# Patient Record
Sex: Female | Born: 1940 | Race: Black or African American | Hispanic: No | Smoking: Never smoker
Health system: Southern US, Community
[De-identification: ages and names within clinical notes are randomized; demographics above are authoritative.]

## PROBLEM LIST (undated history)

## (undated) DIAGNOSIS — R011 Cardiac murmur, unspecified: Secondary | ICD-10-CM

## (undated) DIAGNOSIS — Z9289 Personal history of other medical treatment: Secondary | ICD-10-CM

## (undated) DIAGNOSIS — H5440 Blindness, one eye, unspecified eye: Secondary | ICD-10-CM

## (undated) DIAGNOSIS — I671 Cerebral aneurysm, nonruptured: Secondary | ICD-10-CM

## (undated) DIAGNOSIS — F419 Anxiety disorder, unspecified: Secondary | ICD-10-CM

## (undated) DIAGNOSIS — E119 Type 2 diabetes mellitus without complications: Secondary | ICD-10-CM

## (undated) DIAGNOSIS — R519 Headache, unspecified: Secondary | ICD-10-CM

## (undated) DIAGNOSIS — I10 Essential (primary) hypertension: Secondary | ICD-10-CM

## (undated) DIAGNOSIS — J45909 Unspecified asthma, uncomplicated: Secondary | ICD-10-CM

## (undated) DIAGNOSIS — R51 Headache: Secondary | ICD-10-CM

## (undated) HISTORY — PX: TONSILLECTOMY: SUR1361

## (undated) HISTORY — PX: CATARACT EXTRACTION W/ INTRAOCULAR LENS  IMPLANT, BILATERAL: SHX1307

## (undated) HISTORY — PX: SHOULDER OPEN ROTATOR CUFF REPAIR: SHX2407

## (undated) HISTORY — PX: DILATION AND CURETTAGE OF UTERUS: SHX78

## (undated) HISTORY — PX: TUBAL LIGATION: SHX77

## (undated) HISTORY — PX: CARDIAC CATHETERIZATION: SHX172

## (undated) HISTORY — PX: LAPAROSCOPIC CHOLECYSTECTOMY: SUR755

---

## 2007-11-25 HISTORY — PX: BRAIN SURGERY: SHX531

## 2016-09-18 ENCOUNTER — Other Ambulatory Visit: Payer: Self-pay | Admitting: Nurse Practitioner

## 2016-09-18 DIAGNOSIS — R51 Headache: Principal | ICD-10-CM

## 2016-09-18 DIAGNOSIS — R519 Headache, unspecified: Secondary | ICD-10-CM

## 2016-10-02 ENCOUNTER — Other Ambulatory Visit: Payer: Self-pay | Admitting: Urology

## 2016-10-03 ENCOUNTER — Other Ambulatory Visit: Payer: Self-pay

## 2016-10-15 ENCOUNTER — Ambulatory Visit
Admission: RE | Admit: 2016-10-15 | Discharge: 2016-10-15 | Disposition: A | Discharge status: Home / Self Care | Payer: Medicare HMO | Source: Ambulatory Visit | Attending: Nurse Practitioner | Admitting: Nurse Practitioner

## 2016-10-15 DIAGNOSIS — R51 Headache: Principal | ICD-10-CM

## 2016-10-15 DIAGNOSIS — R519 Headache, unspecified: Secondary | ICD-10-CM

## 2016-10-21 ENCOUNTER — Observation Stay (HOSPITAL_COMMUNITY)
Admission: EM | Admit: 2016-10-21 | Discharge: 2016-10-23 | Disposition: A | Discharge status: Home / Self Care | Payer: Medicare HMO | Attending: Internal Medicine | Admitting: Internal Medicine

## 2016-10-21 ENCOUNTER — Encounter (HOSPITAL_COMMUNITY): Payer: Self-pay | Admitting: *Deleted

## 2016-10-21 ENCOUNTER — Emergency Department (HOSPITAL_COMMUNITY): Payer: Medicare HMO

## 2016-10-21 DIAGNOSIS — R109 Unspecified abdominal pain: Secondary | ICD-10-CM | POA: Diagnosis not present

## 2016-10-21 DIAGNOSIS — E860 Dehydration: Secondary | ICD-10-CM | POA: Diagnosis not present

## 2016-10-21 DIAGNOSIS — I729 Aneurysm of unspecified site: Secondary | ICD-10-CM

## 2016-10-21 DIAGNOSIS — I700 Atherosclerosis of aorta: Secondary | ICD-10-CM | POA: Diagnosis not present

## 2016-10-21 DIAGNOSIS — R55 Syncope and collapse: Principal | ICD-10-CM | POA: Insufficient documentation

## 2016-10-21 DIAGNOSIS — R197 Diarrhea, unspecified: Secondary | ICD-10-CM | POA: Insufficient documentation

## 2016-10-21 DIAGNOSIS — G9389 Other specified disorders of brain: Secondary | ICD-10-CM | POA: Diagnosis not present

## 2016-10-21 DIAGNOSIS — D259 Leiomyoma of uterus, unspecified: Secondary | ICD-10-CM | POA: Diagnosis not present

## 2016-10-21 DIAGNOSIS — I10 Essential (primary) hypertension: Secondary | ICD-10-CM | POA: Insufficient documentation

## 2016-10-21 DIAGNOSIS — I447 Left bundle-branch block, unspecified: Secondary | ICD-10-CM | POA: Insufficient documentation

## 2016-10-21 DIAGNOSIS — I671 Cerebral aneurysm, nonruptured: Secondary | ICD-10-CM | POA: Diagnosis not present

## 2016-10-21 DIAGNOSIS — Z8673 Personal history of transient ischemic attack (TIA), and cerebral infarction without residual deficits: Secondary | ICD-10-CM | POA: Insufficient documentation

## 2016-10-21 DIAGNOSIS — R4182 Altered mental status, unspecified: Secondary | ICD-10-CM

## 2016-10-21 DIAGNOSIS — Z87898 Personal history of other specified conditions: Secondary | ICD-10-CM

## 2016-10-21 HISTORY — DX: Cerebral aneurysm, nonruptured: I67.1

## 2016-10-21 LAB — URINALYSIS, ROUTINE W REFLEX MICROSCOPIC
Bilirubin Urine: NEGATIVE
Glucose, UA: >1000 mg/dL — AB
Ketones, ur: 15 mg/dL — AB
LEUKOCYTES UA: NEGATIVE
NITRITE: NEGATIVE
PROTEIN: NEGATIVE mg/dL
Specific Gravity, Urine: 1.024 (ref 1.005–1.030)
pH: 5 (ref 5.0–8.0)

## 2016-10-21 LAB — COMPREHENSIVE METABOLIC PANEL
ALT: 22 U/L (ref 14–54)
AST: 42 U/L — AB (ref 15–41)
Albumin: 4.3 g/dL (ref 3.5–5.0)
Alkaline Phosphatase: 71 U/L (ref 38–126)
Anion gap: 9 (ref 5–15)
BUN: 31 mg/dL — AB (ref 6–20)
CHLORIDE: 102 mmol/L (ref 101–111)
CO2: 26 mmol/L (ref 22–32)
Calcium: 10.1 mg/dL (ref 8.9–10.3)
Creatinine, Ser: 1.26 mg/dL — ABNORMAL HIGH (ref 0.44–1.00)
GFR, EST AFRICAN AMERICAN: 47 mL/min — AB (ref >60)
GFR, EST NON AFRICAN AMERICAN: 41 mL/min — AB (ref >60)
Glucose, Bld: 190 mg/dL — ABNORMAL HIGH (ref 65–99)
POTASSIUM: 4.5 mmol/L (ref 3.5–5.1)
Sodium: 137 mmol/L (ref 135–145)
Total Bilirubin: 1.4 mg/dL — ABNORMAL HIGH (ref 0.3–1.2)
Total Protein: 7.4 g/dL (ref 6.5–8.1)

## 2016-10-21 LAB — CBC WITH DIFFERENTIAL/PLATELET
BASOS ABS: 0.1 10*3/uL (ref 0.0–0.1)
Basophils Relative: 1 %
EOS PCT: 4 %
Eosinophils Absolute: 0.3 10*3/uL (ref 0.0–0.7)
HCT: 39.9 % (ref 36.0–46.0)
Hemoglobin: 12.9 g/dL (ref 12.0–15.0)
LYMPHS PCT: 32 %
Lymphs Abs: 2.4 10*3/uL (ref 0.7–4.0)
MCH: 26.8 pg (ref 26.0–34.0)
MCHC: 32.3 g/dL (ref 30.0–36.0)
MCV: 83 fL (ref 78.0–100.0)
MONO ABS: 0.5 10*3/uL (ref 0.1–1.0)
Monocytes Relative: 6 %
Neutro Abs: 4.3 10*3/uL (ref 1.7–7.7)
Neutrophils Relative %: 57 %
PLATELETS: 325 10*3/uL (ref 150–400)
RBC: 4.81 MIL/uL (ref 3.87–5.11)
RDW: 13.5 % (ref 11.5–15.5)
WBC: 7.5 10*3/uL (ref 4.0–10.5)

## 2016-10-21 LAB — MAGNESIUM: Magnesium: 2 mg/dL (ref 1.7–2.4)

## 2016-10-21 LAB — TROPONIN I: Troponin I: <0.03 ng/mL (ref <0.03)

## 2016-10-21 LAB — TSH: TSH: 0.882 u[IU]/mL (ref 0.350–4.500)

## 2016-10-21 LAB — URINE MICROSCOPIC-ADD ON

## 2016-10-21 MED ORDER — LORAZEPAM 2 MG/ML IJ SOLN
0.5000 mg | Freq: Once | INTRAMUSCULAR | Status: DC
  Filled 2016-10-21: qty 1

## 2016-10-21 MED ORDER — METOPROLOL SUCCINATE ER 100 MG PO TB24
100.0000 mg | ORAL_TABLET | Freq: Every evening | ORAL | Status: DC
  Administered 2016-10-21 – 2016-10-22 (×2): 100 mg via ORAL
  Filled 2016-10-21 (×2): qty 1

## 2016-10-21 MED ORDER — SODIUM CHLORIDE 0.9 % IV SOLN: INTRAVENOUS | Status: DC

## 2016-10-21 MED ORDER — ONDANSETRON HCL 4 MG/2ML IJ SOLN: 4.0000 mg | Freq: Four times a day (QID) | INTRAMUSCULAR | Status: DC | PRN

## 2016-10-21 MED ORDER — METOPROLOL SUCCINATE ER 25 MG PO TB24
50.0000 mg | ORAL_TABLET | Freq: Every day | ORAL | Status: DC
  Administered 2016-10-22 – 2016-10-23 (×2): 50 mg via ORAL
  Filled 2016-10-21 (×2): qty 2

## 2016-10-21 MED ORDER — IRBESARTAN 150 MG PO TABS
150.0000 mg | ORAL_TABLET | Freq: Every day | ORAL | Status: DC
  Filled 2016-10-21 (×2): qty 1

## 2016-10-21 MED ORDER — METOPROLOL SUCCINATE ER 25 MG PO TB24: 50.0000 mg | ORAL_TABLET | ORAL | Status: DC

## 2016-10-21 MED ORDER — ONDANSETRON HCL 4 MG PO TABS: 4.0000 mg | ORAL_TABLET | Freq: Four times a day (QID) | ORAL | Status: DC | PRN

## 2016-10-21 MED ORDER — ALBUTEROL SULFATE (2.5 MG/3ML) 0.083% IN NEBU: 2.5000 mg | INHALATION_SOLUTION | RESPIRATORY_TRACT | Status: DC | PRN

## 2016-10-21 MED ORDER — SODIUM CHLORIDE 0.9% FLUSH
3.0000 mL | Freq: Two times a day (BID) | INTRAVENOUS | Status: DC
Start: 2016-10-21 — End: 2016-10-23

## 2016-10-21 MED ORDER — LEVALBUTEROL HCL 0.63 MG/3ML IN NEBU: 0.6300 mg | INHALATION_SOLUTION | Freq: Four times a day (QID) | RESPIRATORY_TRACT | Status: DC | PRN

## 2016-10-21 MED ORDER — POTASSIUM CHLORIDE IN NACL 20-0.9 MEQ/L-% IV SOLN: INTRAVENOUS | Status: DC

## 2016-10-21 MED ORDER — IOPAMIDOL (ISOVUE-370) INJECTION 76%
INTRAVENOUS | Status: AC
  Administered 2016-10-21: 80 mL
  Filled 2016-10-21: qty 100

## 2016-10-21 MED ORDER — ENOXAPARIN SODIUM 40 MG/0.4ML ~~LOC~~ SOLN
40.0000 mg | SUBCUTANEOUS | Status: DC
  Filled 2016-10-21 (×2): qty 0.4

## 2016-10-21 MED ORDER — SODIUM CHLORIDE 0.9 % IV BOLUS (SEPSIS)
1000.0000 mL | Freq: Once | INTRAVENOUS | Status: AC
  Administered 2016-10-21: 1000 mL via INTRAVENOUS

## 2016-10-21 MED ORDER — ACETAMINOPHEN 325 MG PO TABS: 650.0000 mg | ORAL_TABLET | Freq: Four times a day (QID) | ORAL | Status: DC | PRN

## 2016-10-21 MED ORDER — AMLODIPINE BESYLATE 10 MG PO TABS
10.0000 mg | ORAL_TABLET | Freq: Every day | ORAL | Status: DC
  Administered 2016-10-22: 10 mg via ORAL
  Filled 2016-10-21 (×2): qty 1

## 2016-10-21 MED ORDER — ACETAMINOPHEN 650 MG RE SUPP: 650.0000 mg | Freq: Four times a day (QID) | RECTAL | Status: DC | PRN

## 2016-10-21 NOTE — ED Provider Notes (Signed)
[facility] DEPT Provider Note   CSN: [id number] Arrival date & time: 10/21/16  1227     History   Chief Complaint Chief Complaint  Patient presents with  . Loss of Consciousness    HPI Arisbet Deterding is a 75 y.o. female.  HPI   Pt with hx cerebral aneurysm clipped in 2009, with recent increase in headaches, negative head CT 10/15/16.  P/W abdominal pain, N/V/D, syncope, diaphoresis that occurred this morning.  Patient reports her abdomen started hurting this morning, located in her lower abdomen with radiation all around her abdomen.  She went to bathroom and started having profuse diarrhea, felt nauseated wanting to vomit.  She was yelling due to the pain.  Developed lightheadedness.  Her daughter came and pt went limp and became diaphoretic.  This lasted several minutes until EMS came, put pt on bed and her gaze became fixed, she said "I can't breathe" and lost consciousness.  This lasted 45-60 seconds.  She became conscious but was confused, was asking to go home despite being in her home.  Daughter reports now she is coming back to her normal level of consciousness.      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 Cerebral aneurysm     Patient Active Problem List   Diagnosis Date Noted  . Syncope and collapse 10/21/2016    History reviewed. No pertinent surgical history.  OB History    No data available       Home Medications    Prior to Admission medications   Medication Sig Start Date End Date Taking? Authorizing Provider  albuterol (PROVENTIL HFA;VENTOLIN HFA) 108 (90 Base) MCG/ACT inhaler Inhale into the lungs every 6 (six) hours as needed for wheezing or shortness of breath.   Yes Historical Provider, MD  amLODipine (NORVASC) 10 MG tablet Take 10 mg by mouth daily.   Yes Historical Provider, MD  canagliflozin (INVOKANA) 300 MG TABS tablet Take 300 mg by mouth daily  before breakfast.   Yes Historical Provider, MD  docusate sodium (COLACE) 100 MG capsule Take 100 mg by mouth 2 (two) times daily.   Yes Historical Provider, MD  hydrochlorothiazide (HYDRODIURIL) 25 MG tablet Take 25 mg by mouth daily.   Yes Historical Provider, MD  metoprolol succinate (TOPROL-XL) 100 MG 24 hr tablet Take 50-100 mg by mouth See admin instructions. Take with or immediately following a meal. Take 50mg  in the morning then take 100mg  in the evening   Yes Historical Provider, MD  Multiple Vitamin (MULTIVITAMIN WITH MINERALS) TABS tablet Take 1 tablet by mouth daily.   Yes Historical Provider, MD  nitrofurantoin (MACRODANTIN) 100 MG capsule Take 100 mg by mouth 2 (two) times daily.   Yes Historical Provider, MD  valsartan (DIOVAN) 160 MG tablet Take 160 mg by mouth daily.   Yes Historical Provider, MD    Family History History reviewed. No pertinent family history.  Social History Social History  Substance Use Topics  . Smoking status: Never Smoker  . Smokeless tobacco: Never Used  . Alcohol use Not on file     Allergies   Patient has no known allergies.   Review of Systems Review of Systems   Physical Exam Updated Vital Signs BP 123/69   Pulse 66   Temp 97.3 F (36.3 C) (Oral)   Resp 14   SpO2 98%   Physical Exam  Constitutional: She appears well-developed and well-nourished.  No distress.  HENT:  Head: Normocephalic and atraumatic.  Neck: Neck supple.  Cardiovascular: Normal rate and regular rhythm.   Pulmonary/Chest: Effort normal and breath sounds normal. No respiratory distress. She has no wheezes. She has no rales.  Abdominal: Soft. She exhibits no distension. There is no tenderness. There is no rebound and no guarding.  Neurological: She is alert.  CN II-XII intact, EOMs intact, no pronator drift, grip strengths equal bilaterally; strength 5/5 in all extremities, sensation intact in all extremities; finger to nose, heel to shin, rapid alternating  movements normal.     Skin: She is not diaphoretic.  Nursing note and vitals reviewed.    ED Treatments / Results  Labs (all labs ordered are listed, but only abnormal results are displayed) Labs Reviewed  COMPREHENSIVE METABOLIC PANEL - Abnormal; Notable for the following:       Result Value   Glucose, Bld 190 (*)    BUN 31 (*)    Creatinine, Ser 1.26 (*)    AST 42 (*)    Total Bilirubin 1.4 (*)    GFR calc non Af Amer 41 (*)    GFR calc Af Amer 47 (*)    All other components within normal limits  URINALYSIS, ROUTINE W REFLEX MICROSCOPIC (NOT AT [facility]) - Abnormal; Notable for the following:    Glucose, UA >1000 (*)    Hgb urine dipstick TRACE (*)    Ketones, ur 15 (*)    All other components within normal limits  URINE MICROSCOPIC-ADD ON - Abnormal; Notable for the following:    Squamous Epithelial / LPF 0-5 (*)    Bacteria, UA RARE (*)    Casts HYALINE CASTS (*)    All other components within normal limits  CBC WITH DIFFERENTIAL/PLATELET  TROPONIN I  CBC  CREATININE, SERUM  MAGNESIUM  TROPONIN I  TROPONIN I  TROPONIN I  HEMOGLOBIN A1C  TSH  URINALYSIS, ROUTINE W REFLEX MICROSCOPIC (NOT AT [facility])    EKG  EKG Interpretation  Date/Time:  Tuesday October 21 2016 12:38:50 EST Ventricular Rate:  59 PR Interval:    QRS Duration: 110 QT Interval:  439 QTC Calculation: 435 R Axis:   57 Text Interpretation:  Sinus rhythm Incomplete left bundle branch block Minimal ST elevation, anterior leads No old tracing to compare Confirmed by [facility] MD, Corene Cornea [phone]) on 10/21/2016 12:46:18 PM       Radiology Ct Angio Chest Pe W And/or Wo Contrast  Result Date: 10/21/2016 CLINICAL DATA:  Shortness of breath and syncopal episode EXAM: CT ANGIOGRAPHY CHEST CT ABDOMEN AND PELVIS WITH CONTRAST TECHNIQUE: Multidetector CT imaging of the chest was performed using the standard protocol during bolus administration of intravenous contrast. Multiplanar CT image reconstructions and MIPs  were obtained to evaluate the vascular anatomy. Multidetector CT imaging of the abdomen and pelvis was performed using the standard protocol during bolus administration of intravenous contrast. CONTRAST:  80 mL Isovue 370. COMPARISON:  None. FINDINGS: CTA CHEST FINDINGS Cardiovascular: The thoracic aorta demonstrates diffuse calcifications without aneurysmal dilatation. Coronary calcifications are seen. The timing of the contrast bolus was not for dissection evaluation. The pulmonary artery shows a normal branching pattern without intraluminal filling defect to suggest pulmonary embolism. Mediastinum/Nodes: No hilar or mediastinal adenopathy is identified. The thoracic inlet is within normal limits. No axillary adenopathy is noted. The esophagus as visualized is within normal limits. Lungs/Pleura: The lungs are well-aerated without focal infiltrate or sizable effusion. No parenchymal nodules are seen. Musculoskeletal: Degenerative changes of  the thoracic spine are noted. No compression deformities are seen. Review of the MIP images confirms the above findings. CT ABDOMEN and PELVIS FINDINGS Hepatobiliary: The gallbladder has been surgically removed. Mild biliary ductal dilatation is noted in a compensatory fashion. The liver is otherwise unremarkable. Pancreas: Unremarkable. No pancreatic ductal dilatation or surrounding inflammatory changes. Spleen: Normal in size without focal abnormality. Adrenals/Urinary Tract: The adrenal glands are within normal limits. Scattered cysts are noted throughout both kidneys. No renal calculi or obstructive changes are seen. Stomach/Bowel: The appendix is within normal limits. Scattered diverticular changes noted. No evidence of diverticulitis is seen. No obstructive changes are noted. Vascular/Lymphatic: Aortic atherosclerosis. No enlarged abdominal or pelvic lymph nodes. Reproductive: Calcified uterine fibroids are seen. Just to the left of the vaginal vault there is a rounded  area of fluid attenuation which measures approximately 11 mm. This may represent a small Gartner's duct cyst. Correlation with the physical exam is recommended. Other: No abdominal wall hernia or abnormality. No abdominopelvic ascites. Musculoskeletal: Degenerative changes of lumbar spine are noted. Review of the MIP images confirms the above findings. IMPRESSION: CTA of the chest: No evidence of pulmonary emboli. CT of the abdomen and pelvis:  Scattered renal cystic change. Fluid attenuation lesion just to the left of the vaginal wall which may represent a small Gartner's duct cyst. Clinical correlation is recommended. No other focal abnormality is noted. Electronically Signed   By: Inez Catalina M.D.   On: 10/21/2016 15:25   Ct Abdomen Pelvis W Contrast  Result Date: 10/21/2016 CLINICAL DATA:  Shortness of breath and syncopal episode EXAM: CT ANGIOGRAPHY CHEST CT ABDOMEN AND PELVIS WITH CONTRAST TECHNIQUE: Multidetector CT imaging of the chest was performed using the standard protocol during bolus administration of intravenous contrast. Multiplanar CT image reconstructions and MIPs were obtained to evaluate the vascular anatomy. Multidetector CT imaging of the abdomen and pelvis was performed using the standard protocol during bolus administration of intravenous contrast. CONTRAST:  80 mL Isovue 370. COMPARISON:  None. FINDINGS: CTA CHEST FINDINGS Cardiovascular: The thoracic aorta demonstrates diffuse calcifications without aneurysmal dilatation. Coronary calcifications are seen. The timing of the contrast bolus was not for dissection evaluation. The pulmonary artery shows a normal branching pattern without intraluminal filling defect to suggest pulmonary embolism. Mediastinum/Nodes: No hilar or mediastinal adenopathy is identified. The thoracic inlet is within normal limits. No axillary adenopathy is noted. The esophagus as visualized is within normal limits. Lungs/Pleura: The lungs are well-aerated without  focal infiltrate or sizable effusion. No parenchymal nodules are seen. Musculoskeletal: Degenerative changes of the thoracic spine are noted. No compression deformities are seen. Review of the MIP images confirms the above findings. CT ABDOMEN and PELVIS FINDINGS Hepatobiliary: The gallbladder has been surgically removed. Mild biliary ductal dilatation is noted in a compensatory fashion. The liver is otherwise unremarkable. Pancreas: Unremarkable. No pancreatic ductal dilatation or surrounding inflammatory changes. Spleen: Normal in size without focal abnormality. Adrenals/Urinary Tract: The adrenal glands are within normal limits. Scattered cysts are noted throughout both kidneys. No renal calculi or obstructive changes are seen. Stomach/Bowel: The appendix is within normal limits. Scattered diverticular changes noted. No evidence of diverticulitis is seen. No obstructive changes are noted. Vascular/Lymphatic: Aortic atherosclerosis. No enlarged abdominal or pelvic lymph nodes. Reproductive: Calcified uterine fibroids are seen. Just to the left of the vaginal vault there is a rounded area of fluid attenuation which measures approximately 11 mm. This may represent a small Gartner's duct cyst. Correlation with the physical exam is recommended. Other:  No abdominal wall hernia or abnormality. No abdominopelvic ascites. Musculoskeletal: Degenerative changes of lumbar spine are noted. Review of the MIP images confirms the above findings. IMPRESSION: CTA of the chest: No evidence of pulmonary emboli. CT of the abdomen and pelvis:  Scattered renal cystic change. Fluid attenuation lesion just to the left of the vaginal wall which may represent a small Gartner's duct cyst. Clinical correlation is recommended. No other focal abnormality is noted. Electronically Signed   By: Inez Catalina M.D.   On: 10/21/2016 15:25    Procedures Procedures (including critical care time)  Medications Ordered in ED Medications    enoxaparin (LOVENOX) injection 40 mg (not administered)  sodium chloride flush (NS) 0.9 % injection 3 mL (not administered)  levalbuterol (XOPENEX) nebulizer solution 0.63 mg (not administered)  ondansetron (ZOFRAN) tablet 4 mg (not administered)    Or  ondansetron (ZOFRAN) injection 4 mg (not administered)  acetaminophen (TYLENOL) tablet 650 mg (not administered)    Or  acetaminophen (TYLENOL) suppository 650 mg (not administered)  0.9 % NaCl with KCl 20 mEq/ L  infusion (not administered)  sodium chloride 0.9 % bolus 1,000 mL (0 mLs Intravenous Stopped 10/21/16 1530)  iopamidol (ISOVUE-370) 76 % injection (80 mLs  Contrast Given 10/21/16 1442)     Initial Impression / Assessment and Plan / ED Course  I have reviewed the triage vital signs and the nursing notes.  Pertinent labs & imaging results that were available during my care of the patient were reviewed by me and considered in my medical decision making (see chart for details).  Clinical Course as of Oct 22 1639  Tue Oct 21, 2016  1556 Paged as unassigned admission.  Per family medicine, Dr Randall Hiss Dean's patients go to [facility].  Will page [facility].    [EW]    Clinical Course User Index [EW] Clayton Bibles, PA-C    Afebrile nontoxic patient with episode of syncope vs seizure following abdominal pain with vomiting and diarrhea.  She did have some staring off and said she could not breathe prior to syncopal episode.  EKG demonstrates some mild ST elevation anteriorly.  There is no old EKG for comparison.  Workup reassuring.  Pt also seen by and discussed with Dr Dayna Barker.  Pt admitted to [facility] for overnight observation on telemetry.    Final Clinical Impressions(s) / ED Diagnoses   Final diagnoses:  Syncope, unspecified syncope type    New Prescriptions New Prescriptions   No medications on file     Clayton Bibles, PA-C 10/21/16 1640    Merrily Pew, MD 10/21/16 1708

## 2016-10-21 NOTE — Progress Notes (Signed)
Patient admitted to floor, denies pain, A&O X4.  MD notified of arrival

## 2016-10-21 NOTE — ED Notes (Signed)
Called CT and informed them pt is ready for transport.

## 2016-10-21 NOTE — Progress Notes (Addendum)
On call notified 2214 for PRN for MRI. Continue to monitor. Order placed 2218 ativan 0.5mg  x1.

## 2016-10-21 NOTE — ED Notes (Signed)
Pt unable to urinate, had diarrhea. Pt informed we will need to do an I&O catheter and she refused. Raquel Sarna, [place] informed.

## 2016-10-21 NOTE — ED Triage Notes (Signed)
Pt arrives from home via [facility]. Pt daughter states pt was on the toilet having a BM when she had a syncopal episode. Fire arrived and attempted to get pt from the toilet to her bedroom and pt experienced another syncopal episode. Fire states pt was gazing off to the right and was aroused only by sternal rub after the seizure. Pt has a hx of a cerebral anneurysm and has recently been complaining of headaches.

## 2016-10-21 NOTE — ED Provider Notes (Signed)
Medical screening examination/treatment/procedure(s) were conducted as a shared visit with non-physician practitioner(s) and myself.  I personally evaluated the patient during the encounter.  Patient's daughter relays history that the patient syncopized right after having diarrhea. But was not having any bradycardia as she checked her pulse at the same time. Apparently The patient appeared sweaty and then subsequently had a shaking episode. She syncopized after that as well. Exam here she has normal vital signs, appears well and is neurologically intact. EKG is okay. Labs are okay. Heart is regular rate and rhythm no murmurs rubs or gallops. Lungs clear to auscultation bilaterally. Unsure of cause of symptoms however questionable seizure activity. Could also have had an arrhythmia although this is unlikely with a measured heart rate in the 80s. Due to the unclear nature of this limits who medicine for further workup and management.   EKG Interpretation  Date/Time:  Tuesday October 21 2016 12:38:50 EST Ventricular Rate:  59 PR Interval:    QRS Duration: 110 QT Interval:  439 QTC Calculation: 435 R Axis:   57 Text Interpretation:  Sinus rhythm Incomplete left bundle branch block Minimal ST elevation, anterior leads No old tracing to compare Confirmed by [facility] MD, Adriann Thau [phone]) on 10/21/2016 12:46:18 PM         Merrily Pew, MD 10/21/16 1708

## 2016-10-21 NOTE — H&P (Signed)
[facility] History and Physical  Jessica Rich [id number] DOB: 14-Jun-1941 DOA: 10/21/2016  Referring physician:  PCP: Rogers Blocker, MD   Chief Complaint: Syncope  HPI:  75 year old female with a brain aneurysm, hypertension presents to the ER via EMS after patient found on the toilet by EMS. Patient's daughter who is an Therapist, sports states that patient woke up this morning, complained of suprapubic discomfort radiating upwards into her abdomen, followed by one episode of diarrhea. Patient became diaphoretic, nauseous, dry heaving on the commode. She called EMS. As soreness the transferred her to the bed, patient experienced a syncopal episode. It lasted a few minutes. During this episode patient was found to have [place] fixed gaze to the right, and was aroused by sternal rub. Patient subsequently had another episode of unresponsiveness 5 minutes later. ER-briefly hypotensive with systolic blood pressure in the 90s, normal sinus rhythm, afebrile, creatinine 1.26, troponin -0.03, white blood cell count 7.5. UA negative. 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  HEENT: Denies photophobia, eye pain, redness, hearing loss, ear pain, congestion, sore throat, rhinorrhea, sneezing, mouth sores, trouble swallowing, neck pain, neck stiffness and tinnitus.  Respiratory: Denies SOB, DOE, cough, chest tightness, and wheezing.  Cardiovascular: Denies chest pain, palpitations and leg swelling.  Gastrointestinal: Positive for nausea, vomiting, abdominal pain, diarrhea, constipation, blood in stool and abdominal distention.  Genitourinary: Denies dysuria, urgency, frequency, hematuria, flank pain and difficulty urinating.  Musculoskeletal: Denies myalgias, back pain, joint swelling, arthralgias and gait problem.  Skin: Denies pallor, rash and wound.  Neurological: Denies dizziness,  seizures, syncope, weakness, light-headedness, numbness and headaches.  Hematological: Denies adenopathy. Easy bruising, personal or family bleeding history  Psychiatric/Behavioral: Denies suicidal ideation, mood changes, confusion, nervousness, sleep disturbance and agitation       Past Medical History:  Diagnosis Date  . Cerebral aneurysm      History reviewed. No pertinent surgical history.    Social History:  reports that she has never smoked. She has never used smokeless tobacco. Her alcohol and drug histories are not on file.    No Known Allergies      FAMILY HISTORY  When questioned  Directly-patient reports  No family history of HTN, CVA ,DIABETES, TB, Cancer CAD, Bleeding Disorders, Sickle Cell, diabetes, anemia, asthma,   Prior to Admission medications   Medication Sig Start Date End Date Taking? Authorizing Provider  albuterol (PROVENTIL HFA;VENTOLIN HFA) 108 (90 Base) MCG/ACT inhaler Inhale into the lungs every 6 (six) hours as needed for wheezing or shortness of breath.   Yes Historical Provider, MD  amLODipine (NORVASC) 10 MG tablet Take 10 mg by mouth daily.   Yes Historical Provider, MD  canagliflozin (INVOKANA) 300 MG TABS tablet Take 300 mg by mouth daily before breakfast.   Yes Historical Provider, MD  docusate sodium (COLACE) 100 MG capsule Take 100 mg by mouth 2 (two) times daily.   Yes Historical Provider, MD  hydrochlorothiazide (HYDRODIURIL) 25 MG tablet Take 25 mg by mouth daily.   Yes Historical Provider, MD  metoprolol succinate (TOPROL-XL) 100 MG 24 hr tablet Take 50-100 mg by mouth See admin instructions. Take with or immediately following a meal. Take 50mg  in the morning then take 100mg  in the evening   Yes Historical Provider, MD  Multiple Vitamin (MULTIVITAMIN WITH MINERALS) TABS tablet Take 1 tablet by mouth daily.   Yes Historical Provider, MD  nitrofurantoin (MACRODANTIN) 100 MG capsule Take 100 mg by mouth 2 (two) times daily.   Yes Historical  Provider, MD  valsartan (DIOVAN) 160 MG tablet Take 160 mg by mouth daily.   Yes Historical Provider, MD     Physical Exam: Vitals:   10/21/16 1600 10/21/16 1645 10/21/16 1720 10/21/16 1750  BP: 123/69 135/87 (!) 121/91 (!) 157/64  Pulse: 66 64 61 62  Resp: 14 18 18 19   Temp:   97.6 F (36.4 C) 98.5 F (36.9 C)  TempSrc:   Oral Oral  SpO2: 98% 100% 97% 98%      Constitutional: NAD, calm, Alert and oriented 3 Vitals:   10/21/16 1600 10/21/16 1645 10/21/16 1720 10/21/16 1750  BP: 123/69 135/87 (!) 121/91 (!) 157/64  Pulse: 66 64 61 62  Resp: 14 18 18 19   Temp:   97.6 F (36.4 C) 98.5 F (36.9 C)  TempSrc:   Oral Oral  SpO2: 98% 100% 97% 98%   Eyes: PERRL, lids and conjunctivae normal ENMT: Mucous membranes are moist. Posterior pharynx clear of any exudate or lesions.Normal dentition.  Neck: normal, supple, no masses, no thyromegaly Respiratory: clear to auscultation bilaterally, no wheezing, no crackles. Normal respiratory effort. No accessory muscle use.  Cardiovascular: Regular rate and rhythm, no murmurs / rubs / gallops. No extremity edema. 2+ pedal pulses. No carotid bruits.  Abdomen: no tenderness, no masses palpated. No hepatosplenomegaly. Bowel sounds positive.  Musculoskeletal: no clubbing / cyanosis. No joint deformity upper and lower extremities. Good ROM, no contractures. Normal muscle tone.  Skin: no rashes, lesions, ulcers. No induration Neurologic: CN 2-12 grossly intact. Sensation intact, DTR normal. Strength 5/5 in all 4.  Psychiatric: Normal judgment and insight. Alert and oriented x 3. Normal mood.     Labs on Admission: I have personally reviewed following labs and imaging studies  CBC:  Recent Labs Lab 10/21/16 1252  WBC 7.5  NEUTROABS 4.3  HGB 12.9  HCT 39.9  MCV 83.0  PLT [id number]    Basic Metabolic Panel:  Recent Labs Lab 10/21/16 1252  NA 137  K 4.5  CL 102  CO2 26  GLUCOSE 190*  BUN 31*  CREATININE 1.26*  CALCIUM 10.1     GFR: CrCl cannot be calculated (Unknown ideal weight.).  Liver Function Tests:  Recent Labs Lab 10/21/16 1252  AST 42*  ALT 22  ALKPHOS 71  BILITOT 1.4*  PROT 7.4  ALBUMIN 4.3   No results for input(s): LIPASE, AMYLASE in the last 168 hours. No results for input(s): AMMONIA in the last 168 hours.  Coagulation Profile: No results for input(s): INR, PROTIME in the last 168 hours. No results for input(s): DDIMER in the last 72 hours.  Cardiac Enzymes:  Recent Labs Lab 10/21/16 1252  TROPONINI <0.03    BNP (last 3 results) No results for input(s): PROBNP in the last 8760 hours.  HbA1C: No results for input(s): HGBA1C in the last 72 hours. No results found for: HGBA1C   CBG: No results for input(s): GLUCAP in the last 168 hours.  Lipid Profile: No results for input(s): CHOL, HDL, LDLCALC, TRIG, CHOLHDL, LDLDIRECT in the last 72 hours.  Thyroid Function Tests: No results for input(s): TSH, T4TOTAL, FREET4, T3FREE, THYROIDAB in the last 72 hours.  Anemia Panel: No results for input(s): VITAMINB12, FOLATE, FERRITIN, TIBC, IRON, RETICCTPCT in the last 72 hours.  Urine analysis:    Component Value Date/Time   COLORURINE YELLOW 10/21/2016 [place] 10/21/2016 1600  LABSPEC 1.024 10/21/2016 1600   PHURINE 5.0 10/21/2016 1600   GLUCOSEU >1000 (A) 10/21/2016 1600   HGBUR TRACE (A) 10/21/2016 1600   BILIRUBINUR NEGATIVE 10/21/2016 1600   KETONESUR 15 (A) 10/21/2016 1600   PROTEINUR NEGATIVE 10/21/2016 1600   NITRITE NEGATIVE 10/21/2016 1600   LEUKOCYTESUR NEGATIVE 10/21/2016 1600    Sepsis Labs: @LABRCNTIP (procalcitonin:4,lacticidven:4) )No results found for this or any previous visit (from the past 240 hour(s)).       Radiological Exams on Admission: Ct Angio Chest Pe W And/or Wo Contrast  Result Date: 10/21/2016 CLINICAL DATA:  Shortness of breath and syncopal episode EXAM: CT ANGIOGRAPHY CHEST CT ABDOMEN AND PELVIS WITH CONTRAST  TECHNIQUE: Multidetector CT imaging of the chest was performed using the standard protocol during bolus administration of intravenous contrast. Multiplanar CT image reconstructions and MIPs were obtained to evaluate the vascular anatomy. Multidetector CT imaging of the abdomen and pelvis was performed using the standard protocol during bolus administration of intravenous contrast. CONTRAST:  80 mL Isovue 370. COMPARISON:  None. FINDINGS: CTA CHEST FINDINGS Cardiovascular: The thoracic aorta demonstrates diffuse calcifications without aneurysmal dilatation. Coronary calcifications are seen. The timing of the contrast bolus was not for dissection evaluation. The pulmonary artery shows a normal branching pattern without intraluminal filling defect to suggest pulmonary embolism. Mediastinum/Nodes: No hilar or mediastinal adenopathy is identified. The thoracic inlet is within normal limits. No axillary adenopathy is noted. The esophagus as visualized is within normal limits. Lungs/Pleura: The lungs are well-aerated without focal infiltrate or sizable effusion. No parenchymal nodules are seen. Musculoskeletal: Degenerative changes of the thoracic spine are noted. No compression deformities are seen. Review of the MIP images confirms the above findings. CT ABDOMEN and PELVIS FINDINGS Hepatobiliary: The gallbladder has been surgically removed. Mild biliary ductal dilatation is noted in a compensatory fashion. The liver is otherwise unremarkable. Pancreas: Unremarkable. No pancreatic ductal dilatation or surrounding inflammatory changes. Spleen: Normal in size without focal abnormality. Adrenals/Urinary Tract: The adrenal glands are within normal limits. Scattered cysts are noted throughout both kidneys. No renal calculi or obstructive changes are seen. Stomach/Bowel: The appendix is within normal limits. Scattered diverticular changes noted. No evidence of diverticulitis is seen. No obstructive changes are noted.  Vascular/Lymphatic: Aortic atherosclerosis. No enlarged abdominal or pelvic lymph nodes. Reproductive: Calcified uterine fibroids are seen. Just to the left of the vaginal vault there is a rounded area of fluid attenuation which measures approximately 11 mm. This may represent a small Gartner's duct cyst. Correlation with the physical exam is recommended. Other: No abdominal wall hernia or abnormality. No abdominopelvic ascites. Musculoskeletal: Degenerative changes of lumbar spine are noted. Review of the MIP images confirms the above findings. IMPRESSION: CTA of the chest: No evidence of pulmonary emboli. CT of the abdomen and pelvis:  Scattered renal cystic change. Fluid attenuation lesion just to the left of the vaginal wall which may represent a small Gartner's duct cyst. Clinical correlation is recommended. No other focal abnormality is noted. Electronically Signed   By: Inez Catalina M.D.   On: 10/21/2016 15:25   Ct Abdomen Pelvis W Contrast  Result Date: 10/21/2016 CLINICAL DATA:  Shortness of breath and syncopal episode EXAM: CT ANGIOGRAPHY CHEST CT ABDOMEN AND PELVIS WITH CONTRAST TECHNIQUE: Multidetector CT imaging of the chest was performed using the standard protocol during bolus administration of intravenous contrast. Multiplanar CT image reconstructions and MIPs were obtained to evaluate the vascular anatomy. Multidetector CT imaging of the abdomen and pelvis was performed using the standard protocol  during bolus administration of intravenous contrast. CONTRAST:  80 mL Isovue 370. COMPARISON:  None. FINDINGS: CTA CHEST FINDINGS Cardiovascular: The thoracic aorta demonstrates diffuse calcifications without aneurysmal dilatation. Coronary calcifications are seen. The timing of the contrast bolus was not for dissection evaluation. The pulmonary artery shows a normal branching pattern without intraluminal filling defect to suggest pulmonary embolism. Mediastinum/Nodes: No hilar or mediastinal  adenopathy is identified. The thoracic inlet is within normal limits. No axillary adenopathy is noted. The esophagus as visualized is within normal limits. Lungs/Pleura: The lungs are well-aerated without focal infiltrate or sizable effusion. No parenchymal nodules are seen. Musculoskeletal: Degenerative changes of the thoracic spine are noted. No compression deformities are seen. Review of the MIP images confirms the above findings. CT ABDOMEN and PELVIS FINDINGS Hepatobiliary: The gallbladder has been surgically removed. Mild biliary ductal dilatation is noted in a compensatory fashion. The liver is otherwise unremarkable. Pancreas: Unremarkable. No pancreatic ductal dilatation or surrounding inflammatory changes. Spleen: Normal in size without focal abnormality. Adrenals/Urinary Tract: The adrenal glands are within normal limits. Scattered cysts are noted throughout both kidneys. No renal calculi or obstructive changes are seen. Stomach/Bowel: The appendix is within normal limits. Scattered diverticular changes noted. No evidence of diverticulitis is seen. No obstructive changes are noted. Vascular/Lymphatic: Aortic atherosclerosis. No enlarged abdominal or pelvic lymph nodes. Reproductive: Calcified uterine fibroids are seen. Just to the left of the vaginal vault there is a rounded area of fluid attenuation which measures approximately 11 mm. This may represent a small Gartner's duct cyst. Correlation with the physical exam is recommended. Other: No abdominal wall hernia or abnormality. No abdominopelvic ascites. Musculoskeletal: Degenerative changes of lumbar spine are noted. Review of the MIP images confirms the above findings. IMPRESSION: CTA of the chest: No evidence of pulmonary emboli. CT of the abdomen and pelvis:  Scattered renal cystic change. Fluid attenuation lesion just to the left of the vaginal wall which may represent a small Gartner's duct cyst. Clinical correlation is recommended. No other focal  abnormality is noted. Electronically Signed   By: Inez Catalina M.D.   On: 10/21/2016 15:25   Ct Head Wo Contrast  Result Date: 10/15/2016 CLINICAL DATA:  History of aneurysm 2009.  Headache for 4 months. EXAM: CT HEAD WITHOUT CONTRAST TECHNIQUE: Contiguous axial images were obtained from the base of the skull through the vertex without intravenous contrast. COMPARISON:  None. FINDINGS: Brain: Area of old infarct in the right frontal and temporal lobes with encephalomalacia. No acute infarction or hemorrhage. No hydrocephalus. Vascular: Prior aneurysm clipping in the right supraclinoid region. Skull: Prior right temporal frontal craniotomy. No acute calvarial abnormality. Sinuses/Orbits: Visualized paranasal sinuses and mastoids clear. Orbital soft tissues unremarkable. Other: None IMPRESSION: Prior right aneurysm clipping. 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 No acute findings. Electronically Signed   By: Rolm Baptise M.D.   On: 10/15/2016 11:12   Ct Angio Chest Pe W And/or Wo Contrast  Result Date: 10/21/2016 CLINICAL DATA:  Shortness of breath and syncopal episode EXAM: CT ANGIOGRAPHY CHEST CT ABDOMEN AND PELVIS WITH CONTRAST TECHNIQUE: Multidetector CT imaging of the chest was performed using the standard protocol during bolus administration of intravenous contrast. Multiplanar CT image reconstructions and MIPs were obtained to evaluate the vascular anatomy. Multidetector CT imaging of the abdomen and pelvis was performed using the standard protocol during bolus administration of intravenous contrast. CONTRAST:  80 mL Isovue 370. COMPARISON:  None. FINDINGS: CTA CHEST FINDINGS Cardiovascular: The thoracic aorta demonstrates diffuse calcifications without aneurysmal dilatation.  Coronary calcifications are seen. The timing of the contrast bolus was not for dissection evaluation. The pulmonary artery shows a normal branching pattern without intraluminal filling defect to suggest  pulmonary embolism. Mediastinum/Nodes: No hilar or mediastinal adenopathy is identified. The thoracic inlet is within normal limits. No axillary adenopathy is noted. The esophagus as visualized is within normal limits. Lungs/Pleura: The lungs are well-aerated without focal infiltrate or sizable effusion. No parenchymal nodules are seen. Musculoskeletal: Degenerative changes of the thoracic spine are noted. No compression deformities are seen. Review of the MIP images confirms the above findings. CT ABDOMEN and PELVIS FINDINGS Hepatobiliary: The gallbladder has been surgically removed. Mild biliary ductal dilatation is noted in a compensatory fashion. The liver is otherwise unremarkable. Pancreas: Unremarkable. No pancreatic ductal dilatation or surrounding inflammatory changes. Spleen: Normal in size without focal abnormality. Adrenals/Urinary Tract: The adrenal glands are within normal limits. Scattered cysts are noted throughout both kidneys. No renal calculi or obstructive changes are seen. Stomach/Bowel: The appendix is within normal limits. Scattered diverticular changes noted. No evidence of diverticulitis is seen. No obstructive changes are noted. Vascular/Lymphatic: Aortic atherosclerosis. No enlarged abdominal or pelvic lymph nodes. Reproductive: Calcified uterine fibroids are seen. Just to the left of the vaginal vault there is a rounded area of fluid attenuation which measures approximately 11 mm. This may represent a small Gartner's duct cyst. Correlation with the physical exam is recommended. Other: No abdominal wall hernia or abnormality. No abdominopelvic ascites. Musculoskeletal: Degenerative changes of lumbar spine are noted. Review of the MIP images confirms the above findings. IMPRESSION: CTA of the chest: No evidence of pulmonary emboli. CT of the abdomen and pelvis:  Scattered renal cystic change. Fluid attenuation lesion just to the left of the vaginal wall which may represent a small Gartner's  duct cyst. Clinical correlation is recommended. No other focal abnormality is noted. Electronically Signed   By: Inez Catalina M.D.   On: 10/21/2016 15:25   Ct Abdomen Pelvis W Contrast  Result Date: 10/21/2016 CLINICAL DATA:  Shortness of breath and syncopal episode EXAM: CT ANGIOGRAPHY CHEST CT ABDOMEN AND PELVIS WITH CONTRAST TECHNIQUE: Multidetector CT imaging of the chest was performed using the standard protocol during bolus administration of intravenous contrast. Multiplanar CT image reconstructions and MIPs were obtained to evaluate the vascular anatomy. Multidetector CT imaging of the abdomen and pelvis was performed using the standard protocol during bolus administration of intravenous contrast. CONTRAST:  80 mL Isovue 370. COMPARISON:  None. FINDINGS: CTA CHEST FINDINGS Cardiovascular: The thoracic aorta demonstrates diffuse calcifications without aneurysmal dilatation. Coronary calcifications are seen. The timing of the contrast bolus was not for dissection evaluation. The pulmonary artery shows a normal branching pattern without intraluminal filling defect to suggest pulmonary embolism. Mediastinum/Nodes: No hilar or mediastinal adenopathy is identified. The thoracic inlet is within normal limits. No axillary adenopathy is noted. The esophagus as visualized is within normal limits. Lungs/Pleura: The lungs are well-aerated without focal infiltrate or sizable effusion. No parenchymal nodules are seen. Musculoskeletal: Degenerative changes of the thoracic spine are noted. No compression deformities are seen. Review of the MIP images confirms the above findings. CT ABDOMEN and PELVIS FINDINGS Hepatobiliary: The gallbladder has been surgically removed. Mild biliary ductal dilatation is noted in a compensatory fashion. The liver is otherwise unremarkable. Pancreas: Unremarkable. No pancreatic ductal dilatation or surrounding inflammatory changes. Spleen: Normal in size without focal abnormality.  Adrenals/Urinary Tract: The adrenal glands are within normal limits. Scattered cysts are noted throughout both kidneys. No renal calculi  or obstructive changes are seen. Stomach/Bowel: The appendix is within normal limits. Scattered diverticular changes noted. No evidence of diverticulitis is seen. No obstructive changes are noted. Vascular/Lymphatic: Aortic atherosclerosis. No enlarged abdominal or pelvic lymph nodes. Reproductive: Calcified uterine fibroids are seen. Just to the left of the vaginal vault there is a rounded area of fluid attenuation which measures approximately 11 mm. This may represent a small Gartner's duct cyst. Correlation with the physical exam is recommended. Other: No abdominal wall hernia or abnormality. No abdominopelvic ascites. Musculoskeletal: Degenerative changes of lumbar spine are noted. Review of the MIP images confirms the above findings. IMPRESSION: CTA of the chest: No evidence of pulmonary emboli. CT of the abdomen and pelvis:  Scattered renal cystic change. Fluid attenuation lesion just to the left of the vaginal wall which may represent a small Gartner's duct cyst. Clinical correlation is recommended. No other focal abnormality is noted. Electronically Signed   By: Inez Catalina M.D.   On: 10/21/2016 15:25      EKG: Independently reviewed. Normal sinus rhythm*  Assessment/Plan Active Problems:   Syncope and collapse Without any focal deficits  CT head showed prior right aneurysm clipping, old right frontal/temporal infarct Given history of aneurysm clips, or CVA we'll order MRI/MRA of the brain 2-D echo to rule out valvular issues Patient could also be dehydrated in the setting of diarrhea this morning Orthostatics pending   Hypertension-continue antihypertensive medications with the exception of HCTZ   Dehydration-creatinine 1.26, baseline is unknown, continue with IV hydration  Diarrhea-one episode this morning, if recurs will order stool studies.  Patient is hungry and would like to eat    DVT prophylaxis:  Lovenox     Code Status Orders Full code        consults called:  Family Communication: Admission, patients condition and plan of care including tests being ordered have been discussed with the patient/Daughter  who indicates understanding and agree with the plan and Code Status  Admission status:Observation  Disposition plan: Further plan will depend as patient's clinical course evolves and further radiologic and laboratory data become available. Likely home when stable      [facility] MD [facility] Pager [phone]  If 7PM-7AM, please contact night-coverage [email or web address] Password [facility]  10/21/2016, 6:11 PM

## 2016-10-21 NOTE — ED Notes (Signed)
Placed patient into a gown and on the monitor 

## 2016-10-21 NOTE — ED Notes (Signed)
Pt from home, had syncopal episode earlier today when having bowel movement. Initially thought possible seizure b/c of somnolence and pt was staring off upon EMS arrival. No evidence of seizure at this time. Pt a&o x 4, VSS, NAD. Pt family member refusing to let pt ambulate to use bedside commode at this time b/c of syncopal episodes at home. Pt able to use bedpan w/ moderate assistance. Hx cerebral aneurysm in past.

## 2016-10-22 ENCOUNTER — Observation Stay (HOSPITAL_BASED_OUTPATIENT_CLINIC_OR_DEPARTMENT_OTHER): Payer: Medicare HMO

## 2016-10-22 DIAGNOSIS — I10 Essential (primary) hypertension: Secondary | ICD-10-CM | POA: Diagnosis not present

## 2016-10-22 DIAGNOSIS — R55 Syncope and collapse: Secondary | ICD-10-CM | POA: Diagnosis not present

## 2016-10-22 DIAGNOSIS — I6789 Other cerebrovascular disease: Secondary | ICD-10-CM

## 2016-10-22 LAB — ECHOCARDIOGRAM COMPLETE
AVLVOTPG: 5 mmHg
CHL CUP MV DEC (S): 363
E/e' ratio: 12.53
EWDT: 363 ms
FS: 38 % (ref 28–44)
IV/PV OW: 1.02
LA ID, A-P, ES: 43 mm
LA vol A4C: 61.2 ml
LA vol: 54.6 mL
LEFT ATRIUM END SYS DIAM: 43 mm
LV PW d: 14.4 mm — AB (ref 0.6–1.1)
LV TDI E'MEDIAL: 7.18
LVEEAVG: 12.53
LVEEMED: 12.53
LVELAT: 8.7 cm/s
LVOT SV: 69 mL
LVOT VTI: 24.2 cm
LVOT area: 2.84 cm2
LVOTD: 19 mm
LVOTPV: 112 cm/s
Lateral S' vel: 10 cm/s
MV Peak grad: 5 mmHg
MV pk A vel: 149 m/s
MVPKEVEL: 109 m/s
RV TAPSE: 26.7 mm
TDI e' lateral: 8.7

## 2016-10-22 LAB — HEMOGLOBIN A1C
Hgb A1c MFr Bld: 7 % — ABNORMAL HIGH (ref 4.8–5.6)
Mean Plasma Glucose: 154 mg/dL

## 2016-10-22 LAB — TROPONIN I: Troponin I: <0.03 ng/mL (ref <0.03)

## 2016-10-22 LAB — GLUCOSE, CAPILLARY: GLUCOSE-CAPILLARY: 135 mg/dL — AB (ref 65–99)

## 2016-10-22 MED ORDER — INSULIN ASPART 100 UNIT/ML ~~LOC~~ SOLN: 0.0000 [IU] | Freq: Every day | SUBCUTANEOUS | Status: DC

## 2016-10-22 MED ORDER — CANAGLIFLOZIN 300 MG PO TABS
300.0000 mg | ORAL_TABLET | Freq: Every day | ORAL | Status: DC
Start: 2016-10-23 — End: 2016-10-23
  Administered 2016-10-23: 300 mg via ORAL
  Filled 2016-10-22: qty 1

## 2016-10-22 MED ORDER — INSULIN ASPART 100 UNIT/ML ~~LOC~~ SOLN: 0.0000 [IU] | Freq: Three times a day (TID) | SUBCUTANEOUS | Status: DC

## 2016-10-22 NOTE — Progress Notes (Signed)
  Echocardiogram 2D Echocardiogram has been performed.  Darlina Sicilian M 10/22/2016, 2:18 PM

## 2016-10-22 NOTE — Progress Notes (Signed)
Patient's daughter expressed concern since patient has not yet had MRI. Per MRI, operative note from aneurysm clipping does not include make, model of clip. Request for medical records including make, model of clip have been filled out by patient and sent to medical records department. Fax confirmed. Will notify radiology when records are received.

## 2016-10-22 NOTE — Care Management Obs Status (Signed)
[place] NOTIFICATION   Patient Details  Name: Jessica Rich MRN: [id number] Date of Birth: 1941-06-04   Medicare Observation Status Notification Given:  Yes (Explained obs letter)    Apolonio Schneiders, RN 10/22/2016, 6:38 PM

## 2016-10-22 NOTE — Progress Notes (Signed)
PROGRESS NOTE    Jessica Rich  [id number] DOB: 03/15/1941 DOA: 10/21/2016 PCP: Rogers Blocker, MD   Outpatient Specialists:    Brief Narrative:  75 year old female with a brain aneurysm, hypertension presents to the ER via EMS after patient found on the toilet by EMS. Patient's daughter who is an Therapist, sports states that patient woke up this morning, complained of suprapubic discomfort radiating upwards into her abdomen, followed by one episode of diarrhea. Patient became diaphoretic, nauseous, dry heaving on the commode. She called EMS. As soreness the transferred her to the bed, patient experienced a syncopal episode. It lasted a few minutes. During this episode patient was found to have [place] fixed gaze to the right, and was aroused by sternal rub. Patient subsequently had another episode of unresponsiveness 5 minutes later. ER-briefly hypotensive with systolic blood pressure in the 90s, normal sinus rhythm, afebrile, creatinine 1.26, troponin -0.03, white blood cell count 7.5. UA negative. 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   Assessment & Plan:   Active Problems:   Syncope and collapse   Vaso-vagal syncope -passed out on toilet after severe abdominal pain -MRI pending proof of previous brain surgery and metal clips -echo pending  Diarrhea -resolved  Dehydration Recheck BMP  HTN -hold HCTZ     DVT prophylaxis:  Lovenox   Code Status:  Full Code   Family Communication:   Disposition Plan:     Subjective: Waiting on daughter to bring documentation for MRI  Objective: Vitals:   10/21/16 2100 10/22/16 0146 10/22/16 0516 10/22/16 0955  BP: (!) 120/56 133/68 126/79 128/60  Pulse: 62 (!) 58 61 66  Resp: 20 18 20 19   Temp: 98.1 F (36.7 C) 98.1 F (36.7 C) 97.7 F (36.5 C) 97.9 F (36.6 C)  TempSrc: Oral Oral Oral Oral  SpO2:  100% 100% 98%   No intake or output data in the 24 hours ending 10/22/16 1530 There were no vitals filed for this  visit.  Examination:  General exam: Appears calm and comfortable  Respiratory system: Clear to auscultation. Respiratory effort normal. Cardiovascular system: S1 & S2 heard, RRR. No JVD, murmurs, rubs, gallops or clicks. No pedal edema. Gastrointestinal system: Abdomen is nondistended, soft and nontender. No organomegaly or masses felt. Normal bowel sounds heard. Central nervous system: Alert and oriented. No focal neurological deficits.     Data Reviewed: I have personally reviewed following labs and imaging studies  CBC:  Recent Labs Lab 10/21/16 1252  WBC 7.5  NEUTROABS 4.3  HGB 12.9  HCT 39.9  MCV 83.0  PLT [id number]   Basic Metabolic Panel:  Recent Labs Lab 10/21/16 1252 10/21/16 1918  NA 137  --   K 4.5  --   CL 102  --   CO2 26  --   GLUCOSE 190*  --   BUN 31*  --   CREATININE 1.26*  --   CALCIUM 10.1  --   MG  --  2.0   GFR: CrCl cannot be calculated (Unknown ideal weight.). Liver Function Tests:  Recent Labs Lab 10/21/16 1252  AST 42*  ALT 22  ALKPHOS 71  BILITOT 1.4*  PROT 7.4  ALBUMIN 4.3   No results for input(s): LIPASE, AMYLASE in the last 168 hours. No results for input(s): AMMONIA in the last 168 hours. Coagulation Profile: No results for input(s): INR, PROTIME in the last 168 hours. Cardiac Enzymes:  Recent Labs Lab 10/21/16 1252 10/21/16 1918 10/21/16 2244 10/22/16 0413  TROPONINI <0.03 <0.03 <  0.03 <0.03   BNP (last 3 results) No results for input(s): PROBNP in the last 8760 hours. HbA1C:  Recent Labs  10/21/16 1819  HGBA1C 7.0*   CBG: No results for input(s): GLUCAP in the last 168 hours. Lipid Profile: No results for input(s): CHOL, HDL, LDLCALC, TRIG, CHOLHDL, LDLDIRECT in the last 72 hours. Thyroid Function Tests:  Recent Labs  10/21/16 1819  TSH 0.882   Anemia Panel: No results for input(s): VITAMINB12, FOLATE, FERRITIN, TIBC, IRON, RETICCTPCT in the last 72 hours. Urine analysis:    Component Value  Date/Time   COLORURINE YELLOW 10/21/2016 1600   APPEARANCEUR CLEAR 10/21/2016 1600   LABSPEC 1.024 10/21/2016 1600   PHURINE 5.0 10/21/2016 1600   GLUCOSEU >1000 (A) 10/21/2016 1600   HGBUR TRACE (A) 10/21/2016 1600   BILIRUBINUR NEGATIVE 10/21/2016 1600   KETONESUR 15 (A) 10/21/2016 1600   PROTEINUR NEGATIVE 10/21/2016 1600   NITRITE NEGATIVE 10/21/2016 1600   LEUKOCYTESUR NEGATIVE 10/21/2016 1600     )No results found for this or any previous visit (from the past 240 hour(s)).    Anti-infectives    None       Radiology Studies: Ct Angio Chest Pe W And/or Wo Contrast  Result Date: 10/21/2016 CLINICAL DATA:  Shortness of breath and syncopal episode EXAM: CT ANGIOGRAPHY CHEST CT ABDOMEN AND PELVIS WITH CONTRAST TECHNIQUE: Multidetector CT imaging of the chest was performed using the standard protocol during bolus administration of intravenous contrast. Multiplanar CT image reconstructions and MIPs were obtained to evaluate the vascular anatomy. Multidetector CT imaging of the abdomen and pelvis was performed using the standard protocol during bolus administration of intravenous contrast. CONTRAST:  80 mL Isovue 370. COMPARISON:  None. FINDINGS: CTA CHEST FINDINGS Cardiovascular: The thoracic aorta demonstrates diffuse calcifications without aneurysmal dilatation. Coronary calcifications are seen. The timing of the contrast bolus was not for dissection evaluation. The pulmonary artery shows a normal branching pattern without intraluminal filling defect to suggest pulmonary embolism. Mediastinum/Nodes: No hilar or mediastinal adenopathy is identified. The thoracic inlet is within normal limits. No axillary adenopathy is noted. The esophagus as visualized is within normal limits. Lungs/Pleura: The lungs are well-aerated without focal infiltrate or sizable effusion. No parenchymal nodules are seen. Musculoskeletal: Degenerative changes of the thoracic spine are noted. No compression  deformities are seen. Review of the MIP images confirms the above findings. CT ABDOMEN and PELVIS FINDINGS Hepatobiliary: The gallbladder has been surgically removed. Mild biliary ductal dilatation is noted in a compensatory fashion. The liver is otherwise unremarkable. Pancreas: Unremarkable. No pancreatic ductal dilatation or surrounding inflammatory changes. Spleen: Normal in size without focal abnormality. Adrenals/Urinary Tract: The adrenal glands are within normal limits. Scattered cysts are noted throughout both kidneys. No renal calculi or obstructive changes are seen. Stomach/Bowel: The appendix is within normal limits. Scattered diverticular changes noted. No evidence of diverticulitis is seen. No obstructive changes are noted. Vascular/Lymphatic: Aortic atherosclerosis. No enlarged abdominal or pelvic lymph nodes. Reproductive: Calcified uterine fibroids are seen. Just to the left of the vaginal vault there is a rounded area of fluid attenuation which measures approximately 11 mm. This may represent a small Gartner's duct cyst. Correlation with the physical exam is recommended. Other: No abdominal wall hernia or abnormality. No abdominopelvic ascites. Musculoskeletal: Degenerative changes of lumbar spine are noted. Review of the MIP images confirms the above findings. IMPRESSION: CTA of the chest: No evidence of pulmonary emboli. CT of the abdomen and pelvis:  Scattered renal cystic change. Fluid attenuation lesion just  to the left of the vaginal wall which may represent a small Gartner's duct cyst. Clinical correlation is recommended. No other focal abnormality is noted. Electronically Signed   By: Inez Catalina M.D.   On: 10/21/2016 15:25   Ct Abdomen Pelvis W Contrast  Result Date: 10/21/2016 CLINICAL DATA:  Shortness of breath and syncopal episode EXAM: CT ANGIOGRAPHY CHEST CT ABDOMEN AND PELVIS WITH CONTRAST TECHNIQUE: Multidetector CT imaging of the chest was performed using the standard  protocol during bolus administration of intravenous contrast. Multiplanar CT image reconstructions and MIPs were obtained to evaluate the vascular anatomy. Multidetector CT imaging of the abdomen and pelvis was performed using the standard protocol during bolus administration of intravenous contrast. CONTRAST:  80 mL Isovue 370. COMPARISON:  None. FINDINGS: CTA CHEST FINDINGS Cardiovascular: The thoracic aorta demonstrates diffuse calcifications without aneurysmal dilatation. Coronary calcifications are seen. The timing of the contrast bolus was not for dissection evaluation. The pulmonary artery shows a normal branching pattern without intraluminal filling defect to suggest pulmonary embolism. Mediastinum/Nodes: No hilar or mediastinal adenopathy is identified. The thoracic inlet is within normal limits. No axillary adenopathy is noted. The esophagus as visualized is within normal limits. Lungs/Pleura: The lungs are well-aerated without focal infiltrate or sizable effusion. No parenchymal nodules are seen. Musculoskeletal: Degenerative changes of the thoracic spine are noted. No compression deformities are seen. Review of the MIP images confirms the above findings. CT ABDOMEN and PELVIS FINDINGS Hepatobiliary: The gallbladder has been surgically removed. Mild biliary ductal dilatation is noted in a compensatory fashion. The liver is otherwise unremarkable. Pancreas: Unremarkable. No pancreatic ductal dilatation or surrounding inflammatory changes. Spleen: Normal in size without focal abnormality. Adrenals/Urinary Tract: The adrenal glands are within normal limits. Scattered cysts are noted throughout both kidneys. No renal calculi or obstructive changes are seen. Stomach/Bowel: The appendix is within normal limits. Scattered diverticular changes noted. No evidence of diverticulitis is seen. No obstructive changes are noted. Vascular/Lymphatic: Aortic atherosclerosis. No enlarged abdominal or pelvic lymph nodes.  Reproductive: Calcified uterine fibroids are seen. Just to the left of the vaginal vault there is a rounded area of fluid attenuation which measures approximately 11 mm. This may represent a small Gartner's duct cyst. Correlation with the physical exam is recommended. Other: No abdominal wall hernia or abnormality. No abdominopelvic ascites. Musculoskeletal: Degenerative changes of lumbar spine are noted. Review of the MIP images confirms the above findings. IMPRESSION: CTA of the chest: No evidence of pulmonary emboli. CT of the abdomen and pelvis:  Scattered renal cystic change. Fluid attenuation lesion just to the left of the vaginal wall which may represent a small Gartner's duct cyst. Clinical correlation is recommended. No other focal abnormality is noted. Electronically Signed   By: Inez Catalina M.D.   On: 10/21/2016 15:25        Scheduled Meds: . amLODipine  10 mg Oral Daily  . enoxaparin (LOVENOX) injection  40 mg Subcutaneous Q24H  . irbesartan  150 mg Oral Daily  . LORazepam  0.5 mg Intravenous Once  . metoprolol succinate  50 mg Oral Daily   And  . metoprolol succinate  100 mg Oral QPM  . sodium chloride flush  3 mL Intravenous Q12H   Continuous Infusions: . sodium chloride       LOS: 0 days    Time spent: 25 min    [place], DO [facility] Pager [phone]  If 7PM-7AM, please contact night-coverage [email or web address] Password [email or web address] 10/22/2016, 3:30 PM

## 2016-10-23 ENCOUNTER — Observation Stay (HOSPITAL_COMMUNITY): Payer: Medicare HMO

## 2016-10-23 DIAGNOSIS — R55 Syncope and collapse: Secondary | ICD-10-CM | POA: Diagnosis not present

## 2016-10-23 DIAGNOSIS — I10 Essential (primary) hypertension: Secondary | ICD-10-CM

## 2016-10-23 LAB — CBC
HEMATOCRIT: 36.1 % (ref 36.0–46.0)
Hemoglobin: 11.6 g/dL — ABNORMAL LOW (ref 12.0–15.0)
MCH: 26.7 pg (ref 26.0–34.0)
MCHC: 32.1 g/dL (ref 30.0–36.0)
MCV: 83.2 fL (ref 78.0–100.0)
Platelets: 265 10*3/uL (ref 150–400)
RBC: 4.34 MIL/uL (ref 3.87–5.11)
RDW: 13.5 % (ref 11.5–15.5)
WBC: 6.5 10*3/uL (ref 4.0–10.5)

## 2016-10-23 LAB — GLUCOSE, CAPILLARY: Glucose-Capillary: 133 mg/dL — ABNORMAL HIGH (ref 65–99)

## 2016-10-23 LAB — BASIC METABOLIC PANEL
Anion gap: 8 (ref 5–15)
BUN: 28 mg/dL — ABNORMAL HIGH (ref 6–20)
CALCIUM: 9.2 mg/dL (ref 8.9–10.3)
CO2: 25 mmol/L (ref 22–32)
CREATININE: 1 mg/dL (ref 0.44–1.00)
Chloride: 102 mmol/L (ref 101–111)
GFR calc non Af Amer: 54 mL/min — ABNORMAL LOW (ref >60)
GLUCOSE: 149 mg/dL — AB (ref 65–99)
Potassium: 3.6 mmol/L (ref 3.5–5.1)
Sodium: 135 mmol/L (ref 135–145)

## 2016-10-23 IMAGING — CT CT HEAD W/O CM
4 series · 19 of 47 positions shown, 21 images · non-contrast
Comparison: CT [DATE]

CLINICAL DATA: Syncope.  Prior RIGHT cerebral aneurysm repair.

EXAM:
CT HEAD WITHOUT CONTRAST
TECHNIQUE: Contiguous axial images were obtained from the base of the skull
through the vertex without intravenous contrast.

[Series 201: head w/o, idose (1) · axial · non-contrast · 0.43mm/px · z∈[+72,+177]mm · 8 of 29 slices shown, 10 images]
[im 4/29  brain]
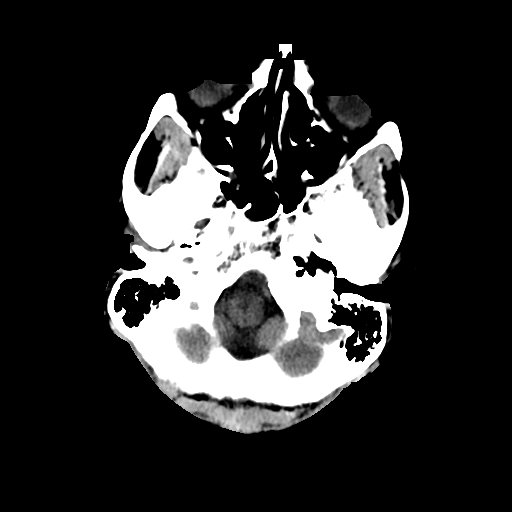
[im 4/29  bone]
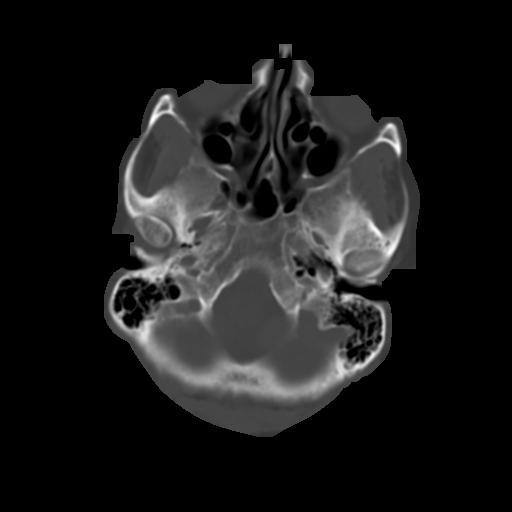
[im 7/29  brain]
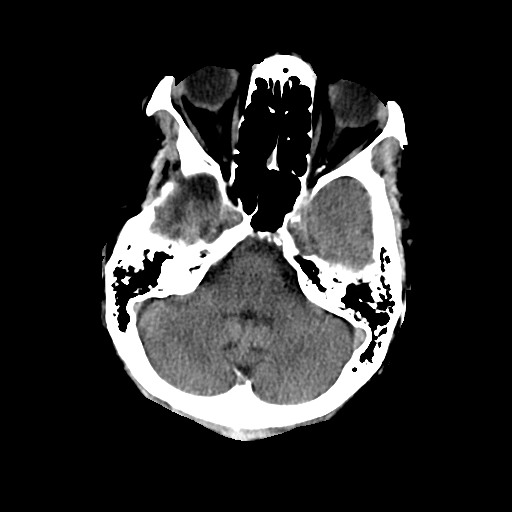
[im 10/29  brain]
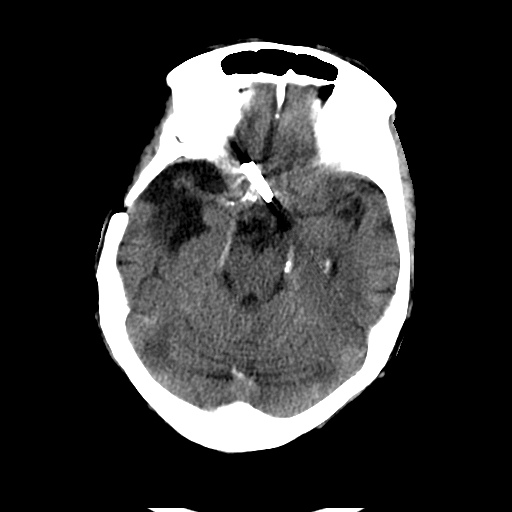
[im 13/29  brain]
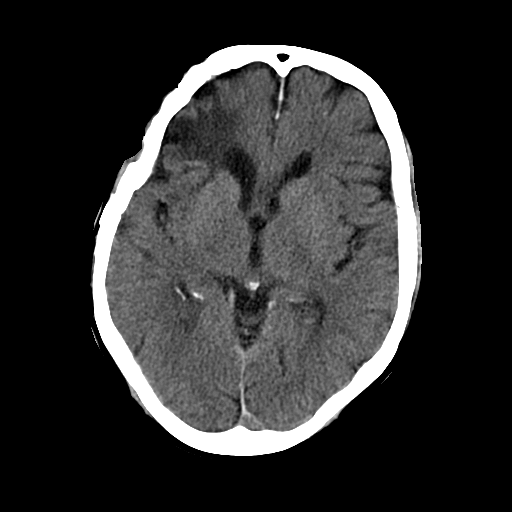
[im 16/29  brain]
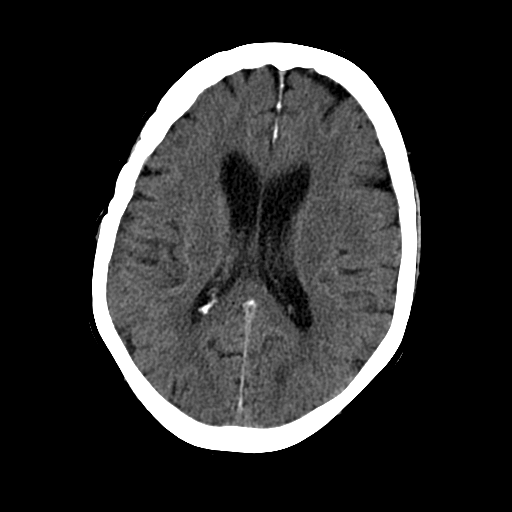
[im 16/29  bone]
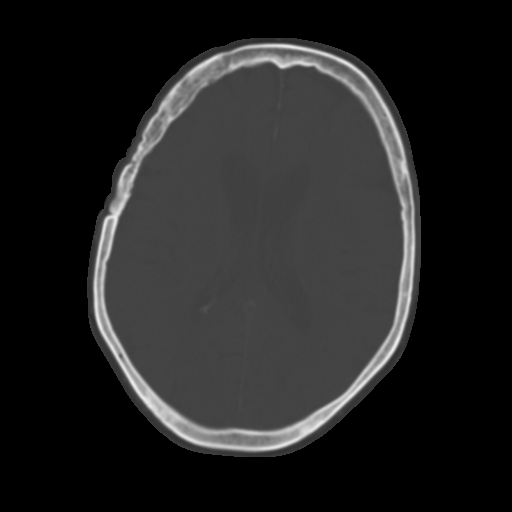
[im 19/29  brain]
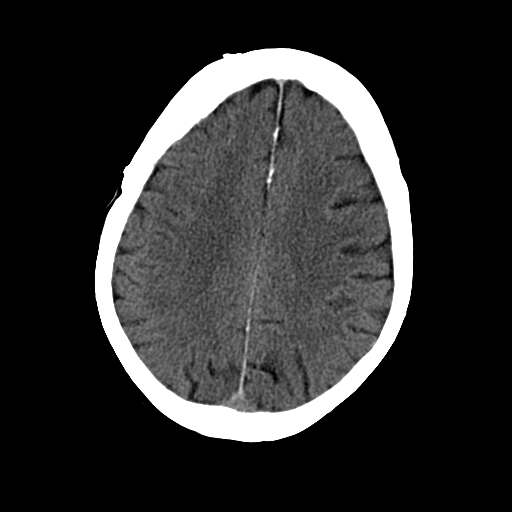
[im 22/29  brain]
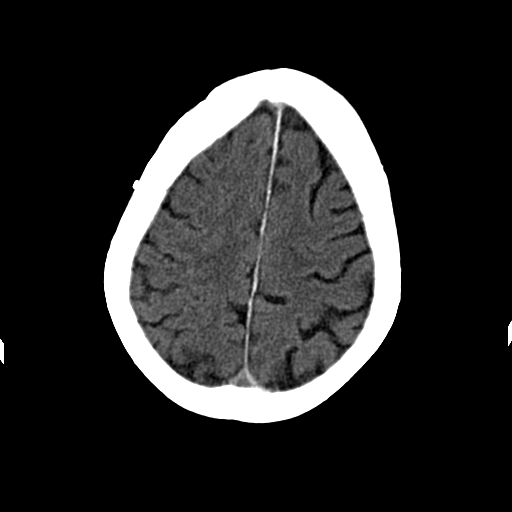
[im 25/29  brain]
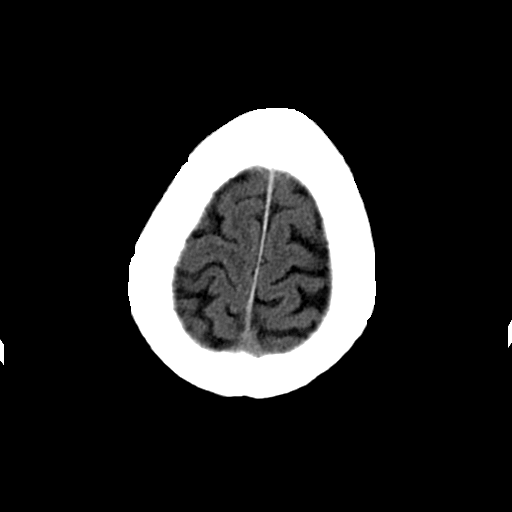

[Series 202: head w/o bone, idose (1) · axial · non-contrast · 0.43mm/px · z∈[+71,+139]mm · 5 of 58 slices shown]
[im 7/58  bone]
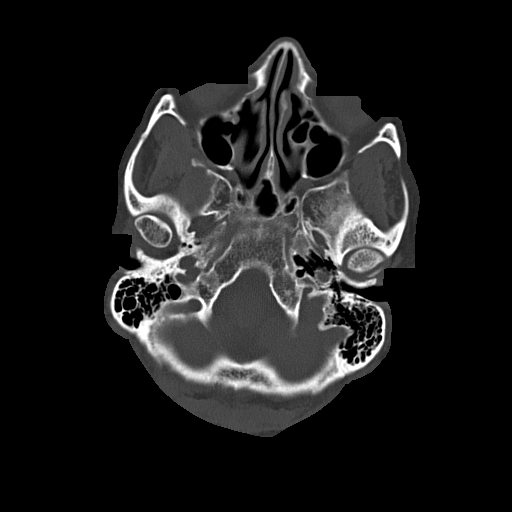
[im 13/58  bone]
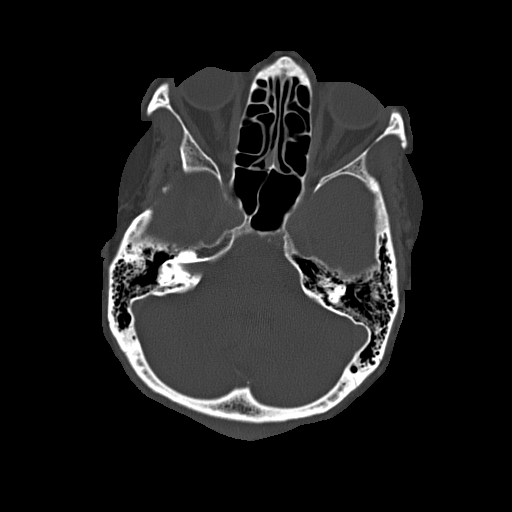
[im 19/58  bone]
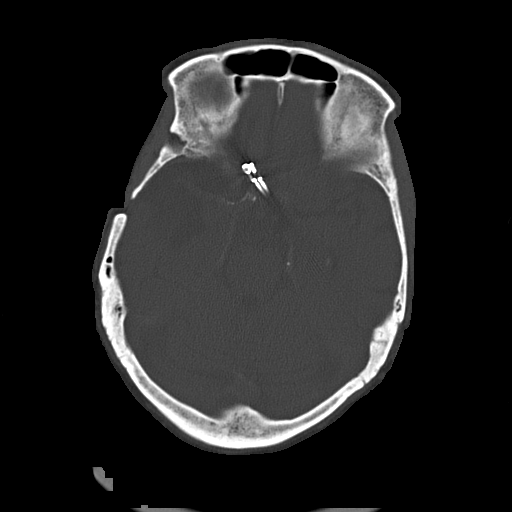
[im 25/58  bone]
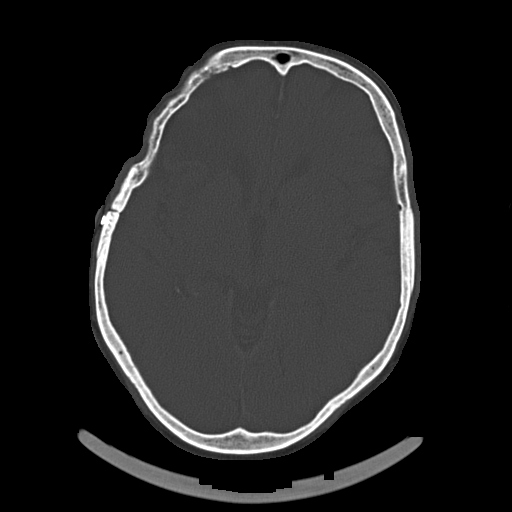
[im 34/58  bone]
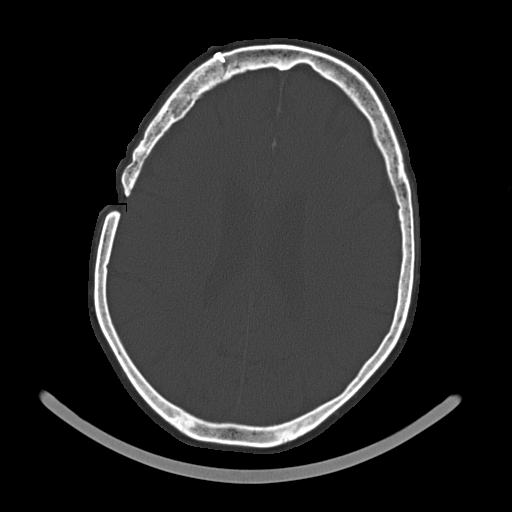

[Series 204: sagittal st, idose (1) · sagittal · 0.40mm/px · 3 of 69 slices shown]
[im 23/69  brain]
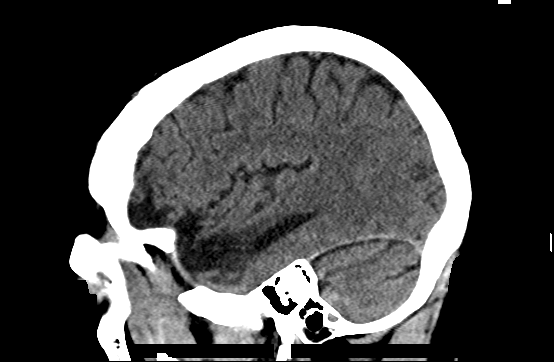
[im 35/69  brain]
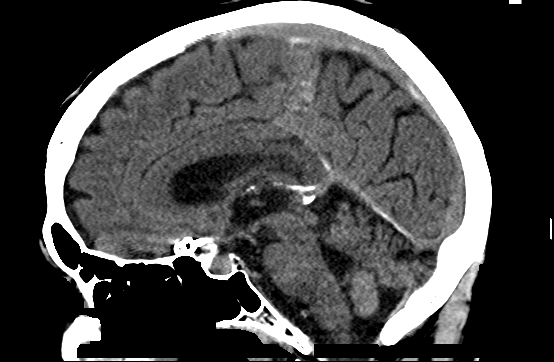
[im 46/69  brain]
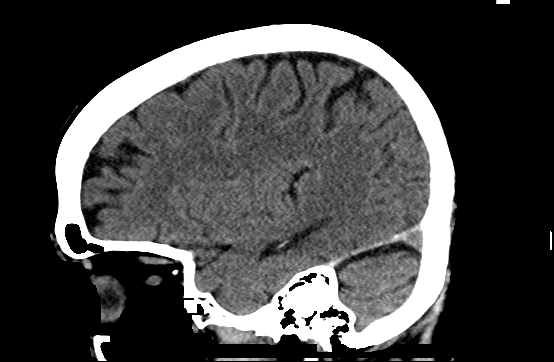

[Series 205: coronal st, idose (1) · coronal · 0.40mm/px · 3 of 68 slices shown]
[im 23/68  brain]
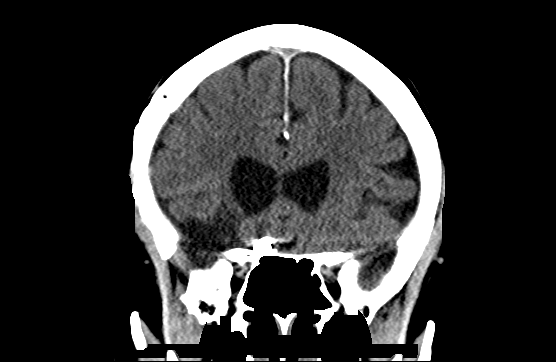
[im 30/68  brain]
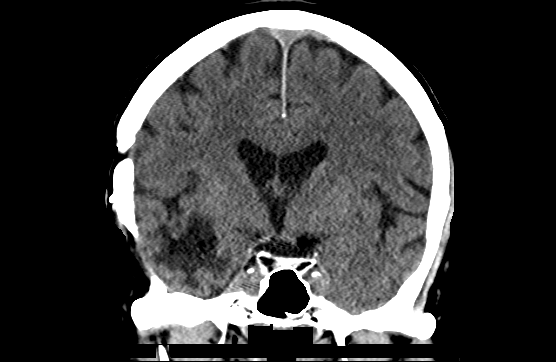
[im 38/68  brain]
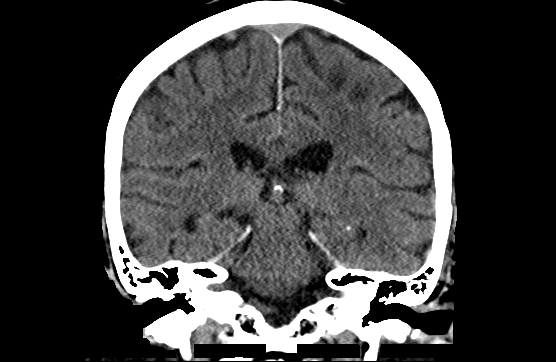

[19 of 47 positions shown; findings below may reference images not displayed]

FINDINGS: Brain: RIGHT supraclinoid aneurysm clip again noted.
Encephalomalacia within the inferior RIGHT temporal lobe and
anterior RIGHT frontal lobe not changed.

No acute intracranial hemorrhage. No intraventricular hemorrhage no
CT evidence of cortical infarction. No midline shift or mass effect

Vascular: RIGHT supraclinoid aneurysm above.

Skull: Craniotomy flap in the RIGHT frontal temporal bone

Sinuses/Orbits: Paranasal sinuses and mastoid air cells are clear.
Orbits are clear.

Other: None
IMPRESSION: 1. No acute intracranial findings.  No change from [DATE].
2. RIGHT supraclinoid aneurysm clipping and RIGHT MCA territory
infarction again noted

## 2016-10-23 NOTE — Progress Notes (Signed)
Pt being discharged from hospital per orders from MD. Pt and daughter educated on discharge instructions. Pt and daughter verbalized understanding of instructions. All questions and concerns were addressed. Pt's IV was removed prior to leaving hospital. Pt ambulated out of hospital.

## 2016-10-23 NOTE — Discharge Summary (Signed)
Physician Discharge Summary  Jessica Rich [id number] DOB: 04-30-1941 DOA: 10/21/2016  PCP: Rogers Blocker, MD  Admit date: 10/21/2016 Discharge date: 10/23/2016   Recommendations for Outpatient Follow-Up:   1. Outpatient colonoscopy   Discharge Diagnosis:   Active Problems:   Syncope and collapse   Discharge disposition:  Home.  SNF:  Discharge Condition: Improved.  Diet recommendation: Low sodium, heart healthy.  Carbohydrate-modified.  Regular.  Wound care: None.   History of Present Illness:   75 year old female with a brain aneurysm, hypertension presents to the ER via EMS after patient found on the toilet by EMS. Patient's daughter who is an Therapist, sports states that patient woke up this morning, complained of suprapubic discomfort radiating upwards into her abdomen, followed by one episode of diarrhea. Patient became diaphoretic, nauseous, dry heaving on the commode. She called EMS. As soreness the transferred her to the bed, patient experienced a syncopal episode. It lasted a few minutes. During this episode patient was found to have [place] fixed gaze to the right, and was aroused by sternal rub. Patient subsequently had another episode of unresponsiveness 5 minutes later. ER-briefly hypotensive with systolic blood pressure in the 90s, normal sinus rhythm, afebrile, creatinine 1.26, troponin -0.03, white blood cell count 7.5. UA negative. 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   Discharge Exam:   Vitals:   10/23/16 0603 10/23/16 0857  BP: (!) 120/51 (!) 127/52  Pulse: (!) 54 62  Resp: 20   Temp: 98.4 F (36.9  C)    Vitals:   10/22/16 2124 10/23/16 0237 10/23/16 0603 10/23/16 0857  BP: 127/64 122/60 (!) 120/51 (!) 127/52  Pulse: 62 60 (!) 54 62  Resp: 20 20 20    Temp: 98.2 F (36.8 C) 98.5 F (36.9 C) 98.4 F (36.9 C)   TempSrc: Oral Oral Oral   SpO2: 100% 100% 95%     Gen:  NAD    The results of significant diagnostics from this hospitalization (including imaging, microbiology, ancillary and laboratory) are listed below for reference.     Procedures and Diagnostic Studies:   Ct Angio Chest Pe W And/or Wo Contrast  Result Date: 10/21/2016 CLINICAL DATA:  Shortness of breath and syncopal episode EXAM: CT ANGIOGRAPHY CHEST CT ABDOMEN AND PELVIS WITH CONTRAST TECHNIQUE: Multidetector CT imaging of the chest was performed using the standard protocol during bolus administration of intravenous contrast. Multiplanar CT image reconstructions and MIPs were obtained to evaluate the vascular anatomy. Multidetector CT imaging of the abdomen and pelvis was performed using the standard protocol during bolus administration of intravenous contrast. CONTRAST:  80 mL Isovue 370. COMPARISON:  None. FINDINGS: CTA CHEST FINDINGS Cardiovascular: The thoracic aorta demonstrates diffuse calcifications without aneurysmal dilatation. Coronary calcifications are seen. The timing of the contrast bolus was not for dissection evaluation. The pulmonary artery shows a normal branching pattern without intraluminal filling defect to suggest pulmonary embolism. Mediastinum/Nodes: No hilar or mediastinal adenopathy is identified. The thoracic inlet is within normal limits. No axillary adenopathy is noted. The esophagus as visualized is within normal limits. Lungs/Pleura: The lungs are well-aerated without focal infiltrate or sizable effusion. No  parenchymal nodules are seen. Musculoskeletal: Degenerative changes of the thoracic spine are noted. No compression deformities are seen. Review of the MIP images confirms the above  findings. CT ABDOMEN and PELVIS FINDINGS Hepatobiliary: The gallbladder has been surgically removed. Mild biliary ductal dilatation is noted in a compensatory fashion. The liver is otherwise unremarkable. Pancreas: Unremarkable. No pancreatic ductal dilatation or surrounding inflammatory changes. Spleen: Normal in size without focal abnormality. Adrenals/Urinary Tract: The adrenal glands are within normal limits. Scattered cysts are noted throughout both kidneys. No renal calculi or obstructive changes are seen. Stomach/Bowel: The appendix is within normal limits. Scattered diverticular changes noted. No evidence of diverticulitis is seen. No obstructive changes are noted. Vascular/Lymphatic: Aortic atherosclerosis. No enlarged abdominal or pelvic lymph nodes. Reproductive: Calcified uterine fibroids are seen. Just to the left of the vaginal vault there is a rounded area of fluid attenuation which measures approximately 11 mm. This may represent a small Gartner's duct cyst. Correlation with the physical exam is recommended. Other: No abdominal wall hernia or abnormality. No abdominopelvic ascites. Musculoskeletal: Degenerative changes of lumbar spine are noted. Review of the MIP images confirms the above findings. IMPRESSION: CTA of the chest: No evidence of pulmonary emboli. CT of the abdomen and pelvis:  Scattered renal cystic change. Fluid attenuation lesion just to the left of the vaginal wall which may represent a small Gartner's duct cyst. Clinical correlation is recommended. No other focal abnormality is noted. Electronically Signed   By: Inez Catalina M.D.   On: 10/21/2016 15:25   Ct Abdomen Pelvis W Contrast  Result Date: 10/21/2016 CLINICAL DATA:  Shortness of breath and syncopal episode EXAM: CT ANGIOGRAPHY CHEST CT ABDOMEN AND PELVIS WITH CONTRAST TECHNIQUE: Multidetector CT imaging of the chest was performed using the standard protocol during bolus administration of intravenous contrast. Multiplanar  CT image reconstructions and MIPs were obtained to evaluate the vascular anatomy. Multidetector CT imaging of the abdomen and pelvis was performed using the standard protocol during bolus administration of intravenous contrast. CONTRAST:  80 mL Isovue 370. COMPARISON:  None. FINDINGS: CTA CHEST FINDINGS Cardiovascular: The thoracic aorta demonstrates diffuse calcifications without aneurysmal dilatation. Coronary calcifications are seen. The timing of the contrast bolus was not for dissection evaluation. The pulmonary artery shows a normal branching pattern without intraluminal filling defect to suggest pulmonary embolism. Mediastinum/Nodes: No hilar or mediastinal adenopathy is identified. The thoracic inlet is within normal limits. No axillary adenopathy is noted. The esophagus as visualized is within normal limits. Lungs/Pleura: The lungs are well-aerated without focal infiltrate or sizable effusion. No parenchymal nodules are seen. Musculoskeletal: Degenerative changes of the thoracic spine are noted. No compression deformities are seen. Review of the MIP images confirms the above findings. CT ABDOMEN and PELVIS FINDINGS Hepatobiliary: The gallbladder has been surgically removed. Mild biliary ductal dilatation is noted in a compensatory fashion. The liver is otherwise unremarkable. Pancreas: Unremarkable. No pancreatic ductal dilatation or surrounding inflammatory changes. Spleen: Normal in size without focal abnormality. Adrenals/Urinary Tract: The adrenal glands are within normal limits. Scattered cysts are noted throughout both kidneys. No renal calculi or obstructive changes are seen. Stomach/Bowel: The appendix is within normal limits. Scattered diverticular changes noted. No evidence of diverticulitis is seen. No obstructive changes are noted. Vascular/Lymphatic: Aortic atherosclerosis. No enlarged abdominal or pelvic lymph nodes. Reproductive: Calcified uterine fibroids are seen. Just to the left of the  vaginal vault there is a rounded area of fluid attenuation which measures approximately 11 mm. This may represent a small Gartner's duct  cyst. Correlation with the physical exam is recommended. Other: No abdominal wall hernia or abnormality. No abdominopelvic ascites. Musculoskeletal: Degenerative changes of lumbar spine are noted. Review of the MIP images confirms the above findings. IMPRESSION: CTA of the chest: No evidence of pulmonary emboli. CT of the abdomen and pelvis:  Scattered renal cystic change. Fluid attenuation lesion just to the left of the vaginal wall which may represent a small Gartner's duct cyst. Clinical correlation is recommended. No other focal abnormality is noted. Electronically Signed   By: Inez Catalina M.D.   On: 10/21/2016 15:25     Labs:   Basic Metabolic Panel:  Recent Labs Lab 10/21/16 1252 10/21/16 1918 10/23/16 0336  NA 137  --  135  K 4.5  --  3.6  CL 102  --  102  CO2 26  --  25  GLUCOSE 190*  --  149*  BUN 31*  --  28*  CREATININE 1.26*  --  1.00  CALCIUM 10.1  --  9.2  MG  --  2.0  --    GFR CrCl cannot be calculated (Unknown ideal weight.). Liver Function Tests:  Recent Labs Lab 10/21/16 1252  AST 42*  ALT 22  ALKPHOS 71  BILITOT 1.4*  PROT 7.4  ALBUMIN 4.3   No results for input(s): LIPASE, AMYLASE in the last 168 hours. No results for input(s): AMMONIA in the last 168 hours. Coagulation profile No results for input(s): INR, PROTIME in the last 168 hours.  CBC:  Recent Labs Lab 10/21/16 1252 10/23/16 0336  WBC 7.5 6.5  NEUTROABS 4.3  --   HGB 12.9 11.6*  HCT 39.9 36.1  MCV 83.0 83.2  PLT 325 265   Cardiac Enzymes:  Recent Labs Lab 10/21/16 1252 10/21/16 1918 10/21/16 2244 10/22/16 0413  TROPONINI <0.03 <0.03 <0.03 <0.03   BNP: Invalid input(s): POCBNP CBG:  Recent Labs Lab 10/22/16 2121 10/23/16 0634  GLUCAP 135* 133*   D-Dimer No results for input(s): DDIMER in the last 72 hours. Hgb A1c  Recent  Labs  10/21/16 1819  HGBA1C 7.0*   Lipid Profile No results for input(s): CHOL, HDL, LDLCALC, TRIG, CHOLHDL, LDLDIRECT in the last 72 hours. Thyroid function studies  Recent Labs  10/21/16 1819  TSH 0.882   Anemia work up No results for input(s): VITAMINB12, FOLATE, FERRITIN, TIBC, IRON, RETICCTPCT in the last 72 hours. Microbiology No results found for this or any previous visit (from the past 240 hour(s)).   Discharge Instructions:   Discharge Instructions    Diet - low sodium heart healthy    Complete by:  As directed    Discharge instructions    Complete by:  As directed    Would hold HCTZ as this is a diuretic and can make you dehydrated   Increase activity slowly    Complete by:  As directed        Medication List    STOP taking these medications   hydrochlorothiazide 25 MG tablet Commonly known as:  HYDRODIURIL     TAKE these medications   albuterol 108 (90 Base) MCG/ACT inhaler Commonly known as:  PROVENTIL HFA;VENTOLIN HFA Inhale into the lungs every 6 (six) hours as needed for wheezing or shortness of breath.   amLODipine 10 MG tablet Commonly known as:  NORVASC Take 10 mg by mouth daily.   docusate sodium 100 MG capsule Commonly known as:  COLACE Take 100 mg by mouth 2 (two) times daily.   INVOKANA 300 MG  Tabs tablet Generic drug:  canagliflozin Take 300 mg by mouth daily before breakfast.   metoprolol succinate 100 MG 24 hr tablet Commonly known as:  TOPROL-XL Take 50-100 mg by mouth See admin instructions. Take with or immediately following a meal. Take 50mg  in the morning then take 100mg  in the evening   multivitamin with minerals Tabs tablet Take 1 tablet by mouth daily.   nitrofurantoin 100 MG capsule Commonly known as:  MACRODANTIN Take 100 mg by mouth 2 (two) times daily.   valsartan 160 MG tablet Commonly known as:  DIOVAN Take 160 mg by mouth daily.      Follow-up Information    Rogers Blocker, MD Follow up in 1 week(s).     Specialty:  Internal Medicine Contact information: [address] [place] [address] [phone]            Time coordinating discharge: 35 min  Signed:  Rickie Gutierres U Latrisa Hellums   [facility] 10/23/2016, 11:38 AM

## 2016-10-24 ENCOUNTER — Other Ambulatory Visit: Payer: Self-pay

## 2016-12-01 ENCOUNTER — Other Ambulatory Visit: Payer: Self-pay | Admitting: Obstetrics and Gynecology

## 2016-12-04 ENCOUNTER — Encounter (HOSPITAL_COMMUNITY): Payer: Medicare HMO

## 2016-12-04 NOTE — Patient Instructions (Signed)
Jessica Rich  12/04/2016   Your procedure is scheduled on: 12/09/16    Report to [facility] Main  Entrance take [place]  elevators to 3rd floor to  [place] at     [place] AM.  Call this number if you have problems the morning of surgery [phone]   Remember: ONLY 1 PERSON MAY GO WITH YOU TO SHORT STAY TO GET  READY MORNING OF [place].  Do not eat food or drink liquids :After Midnight.     Take these medicines the morning of surgery with A SIP OF WATER: Albuterol Inhaler if needed and bring, Amlodipine ( Norvasc), Metoprolol ( Toprol) DO NOT TAKE ANY DIABETIC MEDICATIONS DAY OF YOUR SURGERY                               You may not have any metal on your body including hair pins and              piercings  Do not wear jewelry, make-up, lotions, powders or perfumes, deodorant             Do not wear nail polish.  Do not shave  48 hours prior to surgery.              Do not bring valuables to the hospital. [place].  Contacts, dentures or bridgework may not be worn into surgery.  Leave suitcase in the car. After surgery it may be brought to your room.       Special Instructions: N/A              Please read over the following fact sheets you were given: _____________________________________________________________________             [facility] - Preparing for Surgery Before surgery, you can play an important role.  Because skin is not sterile, your skin needs to be as free of germs as possible.  You can reduce the number of germs on your skin by washing with CHG (chlorahexidine gluconate) soap before surgery.  CHG is an antiseptic cleaner which kills germs and bonds with the skin to continue killing germs even after washing. Please DO NOT use if you have an allergy to CHG or antibacterial soaps.  If your skin becomes reddened/irritated stop using the CHG and inform your nurse when you arrive  at Short Stay. Do not shave (including legs and underarms) for at least 48 hours prior to the first CHG shower.  You may shave your face/neck. Please follow these instructions carefully:  1.  Shower with CHG Soap the night before surgery and the  morning of Surgery.  2.  If you choose to wash your hair, wash your hair first as usual with your  normal  shampoo.  3.  After you shampoo, rinse your hair and body thoroughly to remove the  shampoo.                           4.  Use CHG as you would any other liquid soap.  You can apply chg directly  to the skin and wash  Gently with a scrungie or clean washcloth.  5.  Apply the CHG Soap to your body ONLY FROM THE NECK DOWN.   Do not use on face/ open                           Wound or open sores. Avoid contact with eyes, ears mouth and genitals (private parts).                       Wash face,  Genitals (private parts) with your normal soap.             6.  Wash thoroughly, paying special attention to the area where your surgery  will be performed.  7.  Thoroughly rinse your body with warm water from the neck down.  8.  DO NOT shower/wash with your normal soap after using and rinsing off  the CHG Soap.                9.  Pat yourself dry with a clean towel.            10.  Wear clean pajamas.            11.  Place clean sheets on your bed the night of your first shower and do not  sleep with pets. Day of Surgery : Do not apply any lotions/deodorants the morning of surgery.  Please wear clean clothes to the hospital/surgery center.  FAILURE TO FOLLOW THESE INSTRUCTIONS MAY RESULT IN THE CANCELLATION OF YOUR SURGERY PATIENT SIGNATURE_________________________________  NURSE SIGNATURE__________________________________  ________________________________________________________________________  WHAT IS A BLOOD TRANSFUSION? Blood Transfusion Information  A transfusion is the replacement of blood or some of its parts. Blood is made  up of multiple cells which provide different functions.  Red blood cells carry oxygen and are used for blood loss replacement.  White blood cells fight against infection.  Platelets control bleeding.  Plasma helps clot blood.  Other blood products are available for specialized needs, such as hemophilia or other clotting disorders. BEFORE THE TRANSFUSION  Who gives blood for transfusions?   Healthy volunteers who are fully evaluated to make sure their blood is safe. This is blood bank blood. Transfusion therapy is the safest it has ever been in the practice of medicine. Before blood is taken from a donor, a complete history is taken to make sure that person has no history of diseases nor engages in risky social behavior (examples are intravenous drug use or sexual activity with multiple partners). The donor's travel history is screened to minimize risk of transmitting infections, such as malaria. The donated blood is tested for signs of infectious diseases, such as HIV and hepatitis. The blood is then tested to be sure it is compatible with you in order to minimize the chance of a transfusion reaction. If you or a relative donates blood, this is often done in anticipation of surgery and is not appropriate for emergency situations. It takes many days to process the donated blood. RISKS AND COMPLICATIONS Although transfusion therapy is very safe and saves many lives, the main dangers of transfusion include:   Getting an infectious disease.  Developing a transfusion reaction. This is an allergic reaction to something in the blood you were given. Every precaution is taken to prevent this. The decision to have a blood transfusion has been considered carefully by your caregiver before blood is given. Blood is not given unless the benefits outweigh  the risks. AFTER THE TRANSFUSION  Right after receiving a blood transfusion, you will usually feel much better and more energetic. This is especially  true if your red blood cells have gotten low (anemic). The transfusion raises the level of the red blood cells which carry oxygen, and this usually causes an energy increase.  The nurse administering the transfusion will monitor you carefully for complications. HOME CARE INSTRUCTIONS  No special instructions are needed after a transfusion. You may find your energy is better. Speak with your caregiver about any limitations on activity for underlying diseases you may have. SEEK MEDICAL CARE IF:   Your condition is not improving after your transfusion.  You develop redness or irritation at the intravenous (IV) site. SEEK IMMEDIATE MEDICAL CARE IF:  Any of the following symptoms occur over the next 12 hours:  Shaking chills.  You have a temperature by mouth above 102 F (38.9 C), not controlled by medicine.  Chest, back, or muscle pain.  People around you feel you are not acting correctly or are confused.  Shortness of breath or difficulty breathing.  Dizziness and fainting.  You get a rash or develop hives.  You have a decrease in urine output.  Your urine turns a dark color or changes to pink, red, or brown. Any of the following symptoms occur over the next 10 days:  You have a temperature by mouth above 102 F (38.9 C), not controlled by medicine.  Shortness of breath.  Weakness after normal activity.  The white part of the eye turns yellow (jaundice).  You have a decrease in the amount of urine or are urinating less often.  Your urine turns a dark color or changes to pink, red, or brown. Document Released: 11/07/2000 Document Revised: 02/02/2012 Document Reviewed: 06/26/2008 [facility] Patient Information 2014 [place], [place].  _______________________________________________________________________

## 2016-12-05 ENCOUNTER — Encounter (HOSPITAL_COMMUNITY)
Admission: RE | Admit: 2016-12-05 | Discharge: 2016-12-05 | Disposition: A | Discharge status: Home / Self Care | Payer: Medicare HMO | Source: Ambulatory Visit | Attending: Family Medicine | Admitting: Family Medicine

## 2016-12-05 ENCOUNTER — Encounter (HOSPITAL_COMMUNITY): Payer: Self-pay

## 2016-12-05 DIAGNOSIS — E119 Type 2 diabetes mellitus without complications: Secondary | ICD-10-CM | POA: Insufficient documentation

## 2016-12-05 DIAGNOSIS — I10 Essential (primary) hypertension: Secondary | ICD-10-CM | POA: Insufficient documentation

## 2016-12-05 HISTORY — DX: Essential (primary) hypertension: I10

## 2016-12-05 HISTORY — DX: Anxiety disorder, unspecified: F41.9

## 2016-12-05 HISTORY — DX: Unspecified asthma, uncomplicated: J45.909

## 2016-12-05 HISTORY — DX: Cerebral aneurysm, nonruptured: I67.1

## 2016-12-05 LAB — ABO/RH: ABO/RH(D): AB POS

## 2016-12-05 LAB — BASIC METABOLIC PANEL
ANION GAP: 9 (ref 5–15)
BUN: 20 mg/dL (ref 6–20)
CALCIUM: 9.9 mg/dL (ref 8.9–10.3)
CO2: 24 mmol/L (ref 22–32)
Chloride: 105 mmol/L (ref 101–111)
Creatinine, Ser: 0.88 mg/dL (ref 0.44–1.00)
GFR calc non Af Amer: >60 mL/min (ref >60)
GLUCOSE: 143 mg/dL — AB (ref 65–99)
Potassium: 3.8 mmol/L (ref 3.5–5.1)
Sodium: 138 mmol/L (ref 135–145)

## 2016-12-05 LAB — CBC
HEMATOCRIT: 39.5 % (ref 36.0–46.0)
Hemoglobin: 12.9 g/dL (ref 12.0–15.0)
MCH: 27.2 pg (ref 26.0–34.0)
MCHC: 32.7 g/dL (ref 30.0–36.0)
MCV: 83.2 fL (ref 78.0–100.0)
Platelets: 315 10*3/uL (ref 150–400)
RBC: 4.75 MIL/uL (ref 3.87–5.11)
RDW: 14 % (ref 11.5–15.5)
WBC: 5.8 10*3/uL (ref 4.0–10.5)

## 2016-12-05 LAB — APTT: aPTT: 29 seconds (ref 24–36)

## 2016-12-05 LAB — GLUCOSE, CAPILLARY: Glucose-Capillary: 152 mg/dL — ABNORMAL HIGH (ref 65–99)

## 2016-12-05 LAB — PROTIME-INR
INR: 0.99
Prothrombin Time: 13 seconds (ref 11.4–15.2)

## 2016-12-05 NOTE — Progress Notes (Signed)
LVMM for Jessica Rich ( Scheduler ) at [facility] and Nicholes Mango [facility] for Dr Garwin Brothers) that patient needs cardiac clearance prior to surgery on 12/09/16.   Reported to Dr Belinda Block ( anesthesia) records requested but not received from office of Huston Foley, NP regarding additional medical history on patient.  Patient reports hx of ? Stent unsure of where stent was placed, also hx of brain aneruysm with brain clipping and 09/2016 visit to ED with vasovagal episode.  Patient is type II DM and hx of hypertension.  Also DR Belinda Block saw EKG done 09/2016 and EKG done 12/05/16.

## 2016-12-05 NOTE — Progress Notes (Signed)
EKG-10/21/16- EPIC  ECHO-10/22/16- EPIC  IN ED- 10/21/16 with syncope

## 2016-12-05 NOTE — Progress Notes (Signed)
Final EKG done 12/05/16- epic

## 2016-12-05 NOTE — Progress Notes (Addendum)
Faxed to Dr Kevan Ny and to Huston Foley, NP medical record release form to request most recent office visit, ekg and labs along with medical record information from MDs in [facility].  Patient does not know names of MDs in [place] .  Patient does report Dr Garwin Brothers office in [place] has brain aneurysm medical info.  Called Dr Garwin Brothers office and LVMM for Medical Records to send any info available on patient to [phone] and call back number of [phone] given. Patient reports hx of ? Stent not sure of locations and does not remember name of MDS.  Placed on chart LOV from office of Dr Marlou Sa- dated 10/09/2016.  REceived confirmation fax from both Dr Marlou Sa and Huston Foley, NP

## 2016-12-08 NOTE — H&P (Signed)
CC/HPI: I was consult above the above provider to assist the patient's prolapse that has worsened over 12 months. She can feel vaginal bulging and she reduces it. The urine stream is good but attempts of spray. She may or may not be utilizing splinting maneuvers. She has urge incontinence with foot on the floor syndrome but denies bedwetting and stress incontinence and does not wear a pad   She voids every 2 or more hours and gets up once a night.   on pelvic examination the patient had a grade 3 cystocele with moderate central defect. It descended just beyond the urethrovesical angle.. I felt that her cervix descended to or 3 cm she could not bear down or coughs hard. She had minimal to no rectocele. She had mild hyper the bladder neck and no stress incontinence. She had moderate atrophy.   The patient has prolapse symptoms and mild urge incontinence. She does have spraying of urination and mild nocturia. a picture was drawn. If the patient ever had surgery she likely would benefit from a transvaginal hysterectomy and cystocele repair and graft and likely vault suspension or fixation of a cuff to the uterosacral ligaments.   Today  Frequency and prolapse are stable.  On urodynamics the patient was catheterized for 75 mL. Her bladder capacity was 679 mL. She may have had low pressure instability reaching a pressure of 6 cm of water but did not leak. She did not leak at 400 mL at 101 cm of water. At 500 mL she leaked a mild amount of Hassan Rowan thought she was having an unstable contraction low pressure at the time. It took her a long time to try to void in the lab. She voided 91 mL with a maximum flow of 5 mL/s. Her residual was 585 mL. She had trouble voiding in the lab setting. There was some straining artifact issues. She may have developed a higher pressure contraction near the end. Fluoroscopically she had a large cystocele noted with prolapse. EMG activity was normal. The details of the urodynamics or  sign indicative a urinary sheet   Picture was drawn. I reviewed a transvaginal hysterectomy with likely a vault prolapse repair and cystocele repair and graft is past reverse watchful waiting   I drew her a picture and we talked about prolapse surgery in detail. Pros, cons, general surgical and anesthetic risks, and other options including behavioral therapy, pessaries, and watchful waiting were discussed. Natural history of prolapse was discussed. She understands that prolapse repairs are successful in 80-85% of cases for prolapse symptoms and can recur anteriorly, posteriorly, and/or apically. She understands that in most cases I use a graft and general risks were discussed. Surgical risks were described but not limited to the discussion of injury to neighboring structures including the bowel (with possible life-threatening sepsis and colostomy), bladder, urethra, vagina (all resulting in further surgery), and ureter (resulting in re-implantation). We talked about injury to nerves/soft tissue leading to debilitating and intractable pelvic, abdominal, and lower extremity pain syndromes and neuropathies. The risks of buttock pain, intractable dyspareunia, and vaginal narrowing and shortening with sequelae were discussed. Bleeding risks, transfusion rates, and infection were discussed. The risk of persistent, de novo, or worsening bladder and/or bowel incontinence/dysfunction was discussed. The need for CIC was described as well the usual post-operative course. The patient understands that she might not reach her treatment goal and that she might be worse following surgery.   based on many factors I did not think a sling was  in her best interest. The chances of worsening incontinence and sequelae was discussed. Mesh issues was discussed.     ALLERGIES: No Known Allergies    MEDICATIONS: Amlodipine Besilate     GU PSH: Complex cystometrogram, w/ void pressure and urethral pressure profile studies, any  technique - 09/02/2016 Complex Uroflow - 09/02/2016 Emg surf Electrd - 09/02/2016 Inject For cystogram - 09/02/2016 Intrabd voidng Press - 09/02/2016      PSH Notes: Aneurism   NON-GU PSH: Bilateral Tubal Ligation Remove Gallbladder    GU PMH: Cystocele, midline - 08/11/2016 Nocturia - 08/11/2016 Urge incontinence - 08/11/2016 Urinary Frequency - 08/11/2016    NON-GU PMH: Asthma Personal history of other diseases of the circulatory system Personal history of other endocrine, nutritional and metabolic disease    FAMILY HISTORY: 1 son - Other 3 daughters - Other Patient's father is deceased - Other Patient's mother is still living 47 - Other    Notes: Mother is 27  Father died of a heart attack   SOCIAL HISTORY: Marital Status: Single Current Smoking Status: Patient has never smoked.  Social Drinker.  Does not drink caffeine.    REVIEW OF SYSTEMS:    GU Review Female:   Patient denies frequent urination, hard to postpone urination, burning /pain with urination, get up at night to urinate, leakage of urine, stream starts and stops, trouble starting your stream, have to strain to urinate, and currently pregnant.  Gastrointestinal (Upper):   Patient denies nausea, vomiting, and indigestion/ heartburn.  Gastrointestinal (Lower):   Patient denies diarrhea and constipation.  Constitutional:   Patient denies fever, night sweats, weight loss, and fatigue.  Skin:   Patient denies skin rash/ lesion and itching.  Eyes:   Patient denies blurred vision and double vision.  Ears/ Nose/ Throat:   Patient denies sinus problems and sore throat.  Hematologic/Lymphatic:   Patient denies swollen glands and easy bruising.  Cardiovascular:   Patient denies leg swelling and chest pains.  Respiratory:   Patient denies cough and shortness of breath.  Endocrine:   Patient denies excessive thirst.  Musculoskeletal:   Patient denies back pain and joint pain.  Neurological:   Patient denies headaches  and dizziness.  Psychologic:   Patient denies depression and anxiety.   VITAL SIGNS: None   PAST DATA REVIEWED:  Source Of History:  Patient   PROCEDURES: None   ASSESSMENT:      ICD-10 Details  1 GU:   Cystocele, midline - N81.11   2   Urge incontinence - N39.41               Notes:   the patient has a great [place] that she wants to visit this Christmas. I think it turns out that the surgery will be after January 9. She would like to proceed with surgery. We'll get her into see Dr. cousins. Her daughter who is a traveling nurse was here today and was very helpful.   On the 2 occasions I have met the patient she does not appear that happy with her current health situation. she was attentive during my explanation and hopefully with the help of her daughter she has good expectations in the pros cons and risks of surgery   After a thorough review of the management options for the patient's condition the patient  elected to proceed with surgical therapy as noted above. We have discussed the potential benefits and risks of the procedure, side effects of the proposed treatment, the likelihood of  the patient achieving the goals of the procedure, and any potential problems that might occur during the procedure or recuperation. Informed consent has been obtained.

## 2016-12-09 ENCOUNTER — Other Ambulatory Visit: Payer: Self-pay

## 2016-12-09 ENCOUNTER — Ambulatory Visit (HOSPITAL_COMMUNITY): Admission: RE | Admit: 2016-12-09 | Payer: Medicare HMO | Source: Ambulatory Visit | Admitting: Urology

## 2016-12-09 ENCOUNTER — Encounter (HOSPITAL_COMMUNITY): Payer: Self-pay

## 2016-12-09 ENCOUNTER — Encounter (HOSPITAL_COMMUNITY): Admission: RE | Payer: Self-pay | Source: Ambulatory Visit

## 2016-12-09 ENCOUNTER — Emergency Department (HOSPITAL_COMMUNITY): Payer: Medicare HMO

## 2016-12-09 ENCOUNTER — Observation Stay (HOSPITAL_COMMUNITY)
Admission: EM | Admit: 2016-12-09 | Discharge: 2016-12-10 | Disposition: A | Discharge status: Home / Self Care | Payer: Medicare HMO | Attending: Family Medicine | Admitting: Family Medicine

## 2016-12-09 DIAGNOSIS — Z87898 Personal history of other specified conditions: Secondary | ICD-10-CM

## 2016-12-09 DIAGNOSIS — E119 Type 2 diabetes mellitus without complications: Secondary | ICD-10-CM

## 2016-12-09 DIAGNOSIS — R072 Precordial pain: Secondary | ICD-10-CM | POA: Diagnosis not present

## 2016-12-09 DIAGNOSIS — J45909 Unspecified asthma, uncomplicated: Secondary | ICD-10-CM | POA: Diagnosis not present

## 2016-12-09 DIAGNOSIS — I2510 Atherosclerotic heart disease of native coronary artery without angina pectoris: Secondary | ICD-10-CM | POA: Diagnosis not present

## 2016-12-09 DIAGNOSIS — I739 Peripheral vascular disease, unspecified: Secondary | ICD-10-CM | POA: Diagnosis not present

## 2016-12-09 DIAGNOSIS — Z79899 Other long term (current) drug therapy: Secondary | ICD-10-CM | POA: Insufficient documentation

## 2016-12-09 DIAGNOSIS — R0789 Other chest pain: Secondary | ICD-10-CM | POA: Diagnosis not present

## 2016-12-09 DIAGNOSIS — I119 Hypertensive heart disease without heart failure: Secondary | ICD-10-CM | POA: Diagnosis present

## 2016-12-09 DIAGNOSIS — I10 Essential (primary) hypertension: Secondary | ICD-10-CM | POA: Insufficient documentation

## 2016-12-09 DIAGNOSIS — R079 Chest pain, unspecified: Secondary | ICD-10-CM | POA: Diagnosis present

## 2016-12-09 HISTORY — DX: Blindness, one eye, unspecified eye: H54.40

## 2016-12-09 HISTORY — DX: Personal history of other medical treatment: Z92.89

## 2016-12-09 HISTORY — DX: Type 2 diabetes mellitus without complications: E11.9

## 2016-12-09 HISTORY — DX: Cardiac murmur, unspecified: R01.1

## 2016-12-09 LAB — BASIC METABOLIC PANEL
Anion gap: 9 (ref 5–15)
BUN: 21 mg/dL — AB (ref 6–20)
CALCIUM: 9.9 mg/dL (ref 8.9–10.3)
CHLORIDE: 107 mmol/L (ref 101–111)
CO2: 23 mmol/L (ref 22–32)
CREATININE: 0.95 mg/dL (ref 0.44–1.00)
GFR calc Af Amer: >60 mL/min (ref >60)
GFR calc non Af Amer: 57 mL/min — ABNORMAL LOW (ref >60)
Glucose, Bld: 139 mg/dL — ABNORMAL HIGH (ref 65–99)
Potassium: 3.9 mmol/L (ref 3.5–5.1)
SODIUM: 139 mmol/L (ref 135–145)

## 2016-12-09 LAB — TYPE AND SCREEN
ABO/RH(D): AB POS
Antibody Screen: NEGATIVE

## 2016-12-09 LAB — TROPONIN I: Troponin I: <0.03 ng/mL (ref <0.03)

## 2016-12-09 LAB — CBC
HCT: 43 % (ref 36.0–46.0)
Hemoglobin: 13.9 g/dL (ref 12.0–15.0)
MCH: 27.3 pg (ref 26.0–34.0)
MCHC: 32.3 g/dL (ref 30.0–36.0)
MCV: 84.3 fL (ref 78.0–100.0)
PLATELETS: 304 10*3/uL (ref 150–400)
RBC: 5.1 MIL/uL (ref 3.87–5.11)
RDW: 14.2 % (ref 11.5–15.5)
WBC: 6.2 10*3/uL (ref 4.0–10.5)

## 2016-12-09 LAB — LIPID PANEL
Cholesterol: 208 mg/dL — ABNORMAL HIGH (ref 0–200)
HDL: 63 mg/dL (ref >40)
LDL CALC: 132 mg/dL — AB (ref 0–99)
TRIGLYCERIDES: 65 mg/dL (ref <150)
Total CHOL/HDL Ratio: 3.3 RATIO
VLDL: 13 mg/dL (ref 0–40)

## 2016-12-09 LAB — GLUCOSE, CAPILLARY
GLUCOSE-CAPILLARY: 172 mg/dL — AB (ref 65–99)
Glucose-Capillary: 130 mg/dL — ABNORMAL HIGH (ref 65–99)

## 2016-12-09 LAB — I-STAT TROPONIN, ED: TROPONIN I, POC: 0.01 ng/mL (ref 0.00–0.08)

## 2016-12-09 LAB — CBG MONITORING, ED: Glucose-Capillary: 120 mg/dL — ABNORMAL HIGH (ref 65–99)

## 2016-12-09 LAB — TSH: TSH: 0.917 u[IU]/mL (ref 0.350–4.500)

## 2016-12-09 SURGERY — COLPORRHAPHY, ANTERIOR, FOR CYSTOCELE REPAIR
Anesthesia: General

## 2016-12-09 MED ORDER — ATORVASTATIN CALCIUM 40 MG PO TABS
40.0000 mg | ORAL_TABLET | Freq: Every day | ORAL | Status: DC
  Administered 2016-12-09: 40 mg via ORAL
  Filled 2016-12-09: qty 1

## 2016-12-09 MED ORDER — ACETAMINOPHEN 325 MG PO TABS: 650.0000 mg | ORAL_TABLET | ORAL | Status: DC | PRN

## 2016-12-09 MED ORDER — ENOXAPARIN SODIUM 40 MG/0.4ML ~~LOC~~ SOLN: 40.0000 mg | SUBCUTANEOUS | Status: DC

## 2016-12-09 MED ORDER — IRBESARTAN 150 MG PO TABS
150.0000 mg | ORAL_TABLET | Freq: Every day | ORAL | Status: DC
  Administered 2016-12-09 – 2016-12-10 (×2): 150 mg via ORAL
  Filled 2016-12-09 (×2): qty 1

## 2016-12-09 MED ORDER — CANAGLIFLOZIN 300 MG PO TABS: 300.0000 mg | ORAL_TABLET | Freq: Every day | ORAL | Status: DC

## 2016-12-09 MED ORDER — AMLODIPINE BESYLATE 10 MG PO TABS
10.0000 mg | ORAL_TABLET | Freq: Every day | ORAL | Status: DC
  Administered 2016-12-09 – 2016-12-10 (×2): 10 mg via ORAL
  Filled 2016-12-09 (×2): qty 1

## 2016-12-09 MED ORDER — ALBUTEROL SULFATE (2.5 MG/3ML) 0.083% IN NEBU: 3.0000 mL | INHALATION_SOLUTION | Freq: Four times a day (QID) | RESPIRATORY_TRACT | Status: DC | PRN

## 2016-12-09 MED ORDER — IOPAMIDOL (ISOVUE-370) INJECTION 76%
INTRAVENOUS | Status: AC
  Administered 2016-12-09: 100 mL
  Filled 2016-12-09: qty 100

## 2016-12-09 MED ORDER — ASPIRIN 81 MG PO CHEW
324.0000 mg | CHEWABLE_TABLET | Freq: Once | ORAL | Status: AC
  Administered 2016-12-09: 324 mg via ORAL
  Filled 2016-12-09: qty 4

## 2016-12-09 MED ORDER — INSULIN ASPART 100 UNIT/ML ~~LOC~~ SOLN: 0.0000 [IU] | Freq: Three times a day (TID) | SUBCUTANEOUS | Status: DC

## 2016-12-09 MED ORDER — METOPROLOL TARTRATE 50 MG PO TABS
50.0000 mg | ORAL_TABLET | Freq: Two times a day (BID) | ORAL | Status: DC
  Administered 2016-12-09 – 2016-12-10 (×2): 50 mg via ORAL
  Filled 2016-12-09 (×2): qty 1

## 2016-12-09 MED ORDER — ONDANSETRON HCL 4 MG/2ML IJ SOLN: 4.0000 mg | Freq: Four times a day (QID) | INTRAMUSCULAR | Status: DC | PRN

## 2016-12-09 NOTE — ED Triage Notes (Signed)
Pt. Developed chest pain last Thursday , sharp and radiates into her back.  On Friday she went had had ECG and blood work.  Saturday they called her and canceled her surgery due to her abnormal ECG.  She has an appoitnment with cardiology on the 22.  This morning she was in the bathroom and they pain started again.  It is sharp and sore inside.  Deep breath increases the pain.  She is also sob. Skin is warm and dry. ECG and labs completed in Triage

## 2016-12-09 NOTE — H&P (Signed)
[facility] Admission History and Physical Service Pager: [phone]  Patient name: Jessica Rich Medical record number: [id number] Date of birth: [birth year] Age: 76 y.o. Gender: female  Primary Care Provider: Rogers Blocker, MD Consultants: Cardiology.  Code Status: Full  Chief Complaint: Chest pain  Assessment and Plan: Jessica Rich is a 76 y.o. female presenting with constant, dull chest pain on the right side with radiation into her back. PMH is significant for brain aneurysm, diabetes, hypertension and coronary artery disease.   Chest Pain likely 2/2 Musculoskeletal Etiology: She presents with 5 days of chest pain that re-intensified this morning and is non-anginal in nature (not exacerbated with exertion, not alleviated with rest, and not central) but is worse with palpation of the site and with deep inspiration. Troponin and EKG are within normal limits at this time. However, heart score is 4 and she has a history of coronary artery disease so we will further rule out anginal equivalent. Less likely PE given negative CTA chest. Do not think this is aortic aneurysm or dissection given lack of evidence on CT chest and no widened mediastinum on CXR. No prior history of GERD. No signs and symptoms to suggest infectious etiology.  - Admitted for observation for ACS rule out. Attending Dr. Andria Frames.  - Trend troponins (negative so far) - Repeat EKG in a.m. - Lipid panel, TSH, A1c - Start atorvastatin 40 mg daily - S/p Aspirin in the ED. - Start baby aspirin - Cardiology has been consulted.  - Recent echocardiogram on 10/22/2016 with EF of 60-65%, moderate LVH, G1DD - We will try baclofen given MSK chest pain - O2 by [place] prn hypoxemia  CAD: She had a stent placement in [place] around 40 years ago. On metoprolol succinate 50 mg in the morning and 100 mg in the evening.  Not currently on a statin or aspirin therapy.  - Metoprolol tartrate 50 mg twice a day -  Obtaining lipid panel - Start atorvastatin 40 mg daily  HFpEF: does not appear to be fluid overloaded at this time. She denies dyspnea and orthopnea although she reports using 6 pillows at baseline from her young age. Echo on 10/22/16 with EF of 60-65%, mod LVH, G1DD. CXR without pulmonary congestion -Continue to monitor  Diabetes: A1c on 10/21/16 was 7.0. - Holding home Invokana 300 mg PO QD. - rSSI.   Hypertension: BP elevated to 209/86 on arrival to ED. Improved to 150s/60s without antihypertensive medication. She is on metoprolol, amlodipine and valsartan at home.  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 Not currently on statin or aspirin therapy.    -May benefit from tight blood pressure control given history of aneurysm -Statin and aspirin as above  History of uterine/bladder/rectal prolapse: surgery was postponed due to "abnormal preop EKG" on 12/05/2016 and she was scheduled with cardiology for cardiac clearance on 12/15/2016.  - Appreciate cariology input  FEN/GI: Heart healthy diet.   Prophylaxis: Lovenox.   Disposition: We will admit to telemetry for observation and ACS rule out.   History of Present Illness:  Jessica Rich is a 76 y.o. female presenting with chest pain. This started 5 days ago and initially was fairly intense in severity. She was recommended to go to the ED by her daughter but decided not to go.  The chest pain subsided over the next 2 days but then returned this morning which prompted her to come to the ED. The location is the right side of the chest with radiation to her back. There has been no radiation into her arms or her jaw. The quality is described as a constant, dull pain that occurs at rest. Pain is exacerbated with deep inspiration. She does get some  associated shortness of breath as well as sweating, but no nausea, chills, sore throat, runny nose, fever, abdominal pain, constipation or diarrhea. This chest pain does not feel similar in regards to the chest pain she felt that required her to get a coronary stent in the past. She was supposed to get a surgery today for bowel, bladder and uterine prolapses, but was told that the surgery was canceled because of an abnormal EKG that she had taken 4 days ago while she was having the chest pain.   In the ED she was evaluated with CBC, BMP and troponin for laboratory work-up. Troponin thus far has been negative and there is no abnormalities on CBC. Her BMP reveals a mild increase in BUN to 21 and an increased glucose to 139, but no other abnormalities. Her EKG was within normal limits and showed no signs of ST segment elevation. Her CXR showed no widened mediastinum concerning for an aortic aneurysm or dissection. A CT chest was performed and showed no evidence of PE. She was also given aspirin in the ED.   Review Of Systems: Per HPI with the following additions:  Review of Systems  Constitutional: Negative for chills and fever.  HENT: Negative for congestion, sinus pain and sore throat.   Respiratory: Positive for shortness of breath.   Cardiovascular: Positive for chest pain. Negative for leg swelling.  Gastrointestinal: Negative for abdominal pain, constipation, diarrhea, nausea and vomiting.    Patient Active Problem List   Diagnosis Date Noted  . Syncope and collapse 10/21/2016    Past Medical History: Past Medical History:  Diagnosis Date  . Anxiety   . Asthma   . Brain aneurysm    had clipping ? date, also hx of right front temporal infarct   . Cerebral aneurysm   . Diabetes mellitus without complication ([place])    type II   . Hypertension     Past Surgical History: Past Surgical History:  Procedure Laterality Date  . BRAIN SURGERY  2009   due to aneurysm   . CHOLECYSTECTOMY     . TONSILLECTOMY    . TUBAL LIGATION      Social History: Social History  Substance Use Topics  . Smoking status: Never Smoker  . Smokeless tobacco: Never Used  . Alcohol use Yes     Comment: occ wine    Additional social history: Currently lives with her daughter and plans to travel the country with her daughter in the near future. No illicit substance use such as marijuana or cocaine.  Please also refer to relevant sections of EMR.  Family History: No family history of CAD, HTN, CVA, diabetes, TB, cancer, bleeding disorders, sickle cell, diabetes, anemia or asthma per chart review.   Allergies and Medications: No Known Allergies No current facility-administered medications on file prior to encounter.    Current Outpatient Prescriptions on File Prior to Encounter  Medication Sig Dispense Refill  . albuterol (PROVENTIL HFA;VENTOLIN HFA) 108 (90 Base) MCG/ACT inhaler Inhale 2 puffs into the lungs every 6 (six) hours as needed for wheezing or shortness of  breath.     Marland Kitchen amLODipine (NORVASC) 10 MG tablet Take 10 mg by mouth daily.    . canagliflozin (INVOKANA) 300 MG TABS tablet Take 300 mg by mouth daily before breakfast.    . docusate sodium (COLACE) 100 MG capsule Take 100 mg by mouth daily.     . metoprolol succinate (TOPROL-XL) 100 MG 24 hr tablet Take 50-100 mg by mouth See admin instructions. Take with or immediately following a meal. Take 50mg  in the morning then take 100mg  in the evening    . phenazopyridine (PYRIDIUM) 200 MG tablet Take 200 mg by mouth once. Patient reports she is to take 2 hours prior to surgery am of surgery    . valsartan (DIOVAN) 160 MG tablet Take 160 mg by mouth daily.      Objective: BP 173/69   Pulse 60   Temp 98.4 F (36.9 C) (Oral)   Resp 17   Ht 5\' 3"  (1.6 m)   Wt 73.9 kg (163 lb)   SpO2 99%   BMI 28.87 kg/m  Exam: General: Well-appearing female laying in bed in no apparent distress.  Eyes: The right and left pupils constrict when light  is shined into the left eye, but there is no constriction of either pupil when a light is shined into the right eye.  ENTM: She has moist mucous membranes. Her head, neck, ears and nose appear normal on gross inspection.  Neck: The neck is supple and she has normal ROM. No JVD is present.  Cardiovascular: She has a regular rate and rhythm without any murmurs, rubs or gallops. Intermittently she will have a split S2 (Physiologic). There are no carotid bruits.  Respiratory: She has a good work of breathing without any tripod posturing. There are no wheezes, crackles or rhonchi in the lungs.  Gastrointestinal: She has a soft, non-tender, non-distended abdomen.  MSK: She is able to move all extremities normally and symmetrically. There is no lower extremity edema.  Derm: There are no obvious rashes noted on the skin.  Neuro: Strength is normal in the upper and lower extremities bilaterally.  Psych: Euthymic affect.   Labs and Imaging: CBC BMET   Recent Labs Lab 12/09/16 1031  WBC 6.2  HGB 13.9  HCT 43.0  PLT 304    Recent Labs Lab 12/09/16 1031  NA 139  K 3.9  CL 107  CO2 23  BUN 21*  CREATININE 0.95  GLUCOSE 139*  CALCIUM 9.9     Trop 0.01  Jessica Rich, MS4 12/09/2016, 2:04 PM [place] Intern pager: [phone], text pages welcome   I have seen and evaluated the patient with Student Dr. Trilby Drummer. I am in agreement with the note above in its revised form.  Wendee Beavers, MD, PGY-2 12/09/2016 5:08 PM

## 2016-12-09 NOTE — ED Notes (Signed)
Pt's CBG result was 120. Informed Molly - RN.

## 2016-12-09 NOTE — ED Notes (Signed)
Pt given sprite zero 

## 2016-12-09 NOTE — ED Notes (Signed)
Pt asked for something to eat. Informed Dr. Tamera Punt.

## 2016-12-09 NOTE — ED Notes (Signed)
Pt went to [facility] on Friday for pre op tests for surgery on Monday-- was called Friday afternoon and told surgery was cancelled and to follow up with cardiology. Pt has stabbing right sided chest pain, sharp, stabbing

## 2016-12-09 NOTE — ED Notes (Signed)
Gave pt bagged lunch ([place] sandwich & applesauce) & Diet Sprite, per Dr. Tamera Punt.

## 2016-12-09 NOTE — ED Notes (Signed)
Patient transported to X-ray 

## 2016-12-09 NOTE — Discharge Summary (Signed)
[facility] Discharge Summary  Patient name: Jessica Rich Medical record number: [id number] Date of birth: Jan 24, 1941 Age: 76 y.o. Gender: female Date of Admission: 12/09/2016  Date of Discharge:12/10/2016 Admitting Physician: Zenia Resides, MD  Primary Care Provider: Rogers Blocker, MD Consultants: Cardiology  Indication for Hospitalization: Chest Pain  Discharge Diagnoses/Problem List:  Musculoskeletal chest pain Hypertension Hyperlipidemia Diabetes Coronary artery disease s/p stent HFpEF EF 60-65% History of cerebral aneurysm and CVA  Disposition: Home  Discharge Condition: Stable  Discharge Exam:  General: NAD, pleasant, able to participate in exam Cardiac: RRR, normal heart sounds, no murmurs. 2+ radial and PT pulses bilaterally Respiratory: CTAB, normal effort, No wheezes, rales or rhonchi Abdomen: soft, nontender, nondistended, no hepatic or splenomegaly, +BS Extremities: no edema or cyanosis. WWP. Skin: warm and dry, no rashes noted Neuro: alert and oriented x4, no focal deficits Psych: Normal affect and mood  Brief Hospital Course:  Jessica Rich is a 76 y.o. female with PMH is significant for brain aneurysm, diabetes, hypertension and coronary artery disease who presented on 12/09/16 with constant,right sided chest pain with radiation to her back. Based on reproducibility of her pain on exam and cardiology evaluation with normal EKG and negative troponin, chest pain was more consistent with MSK pain. CT chest was negative for PE, dissection or aneurysm and CXR did not show any evidence of pneumonia. Given patient upcoming gynecologic surgery and initial presentation for chest pain lexiscan was scheduled as part of her preop work up. Lipid panel show elevated cholesterol and HDL and patient was started on statin and aspirin for secondary prevention. Patient was cleared by cardiology for her upcoming surgery. Preliminary result of stress test  were negative. On discharge day, patient was stable and pain had resolved.   Issues for Follow Up:  1. Patient was starting on atorvastatin 40 mg given extensive cardiac history and risk factors for secondary prevention 2. Patient was started on aspirin 81 mg daily. 3. Patient's metoprolol was decreased to 25 mg twice a day. 4. Patient was cleared by cardiology for surgery given recent abnormal EKG. 5. Follow up on final results of Lexiscan myoview.  Significant Procedures: Lexiscan myoview  Significant Labs and Imaging:   Recent Labs Lab 12/05/16 1032 12/09/16 1031  WBC 5.8 6.2  HGB 12.9 13.9  HCT 39.5 43.0  PLT 315 304    Recent Labs Lab 12/05/16 1032 12/09/16 1031  NA 138 139  K 3.8 3.9  CL 105 107  CO2 24 23  GLUCOSE 143* 139*  BUN 20 21*  CREATININE 0.88 0.95  CALCIUM 9.9 9.9   A1c: 6.3 TSH: 0.917 Lipid Panel     Component Value Date/Time   CHOL 208 (H) 12/09/2016 1925   TRIG 65 12/09/2016 1925   HDL 63 12/09/2016 1925   CHOLHDL 3.3 12/09/2016 1925   VLDL 13 12/09/2016 1925   LDLCALC 132 (H) 12/09/2016 1925    Results/Tests Pending at Time of Discharge: Lexiscan myoview final result.** Preliminary result were normal**  Discharge Medications:  Allergies as of 12/10/2016   No Known Allergies     Medication List    TAKE these medications   albuterol 108 (90 Base) MCG/ACT inhaler Commonly known as:  PROVENTIL HFA;VENTOLIN HFA Inhale 2 puffs into the lungs every 6 (six) hours as needed for wheezing or shortness of breath.   amLODipine 10 MG tablet Commonly known as:  NORVASC Take 10 mg by mouth daily.   aspirin 81 MG EC tablet Take  1 tablet (81 mg total) by mouth daily.   atorvastatin 40 MG tablet Commonly known as:  LIPITOR Take 1 tablet (40 mg total) by mouth daily at 6 PM.   docusate sodium 100 MG capsule Commonly known as:  COLACE Take 100 mg by mouth daily.   INVOKANA 300 MG Tabs tablet Generic drug:  canagliflozin Take 300 mg by  mouth daily before breakfast.   metoprolol succinate 25 MG 24 hr tablet Commonly known as:  TOPROL-XL Take 1 tablet (25 mg total) by mouth 2 (two) times daily. What changed:  medication strength  how much to take  when to take this  additional instructions   phenazopyridine 200 MG tablet Commonly known as:  PYRIDIUM Take 200 mg by mouth once. Patient reports she is to take 2 hours prior to surgery am of surgery   valsartan 160 MG tablet Commonly known as:  DIOVAN Take 160 mg by mouth daily.       Discharge Instructions: Please refer to Patient Instructions section of EMR for full details.  Patient was counseled important signs and symptoms that should prompt return to medical care, changes in medications, dietary instructions, activity restrictions, and follow up appointments.   Follow-Up Appointments: Patient will make follow up appointment with PCP upon discharge.  Marjie Skiff MD 12/10/2016, 11:54 AM [place]

## 2016-12-09 NOTE — Consult Note (Signed)
Reason for Consult:   Chest pain  Requesting Physician: Family Practice Primary Cardiologist New  HPI:   76 y/o female with HTN and DM we are asked to see for chest pain. The pt was previously living in [place]. She has a history of a brain aneurysm rupture and surgery there 9 yrs ago. She also has what sounds like a coronary angiogram "30 years ago" in [place]. She moved here in July 2017 to live with her daughter who is a traveling [employer] scientist. The pt recently was admitted for what sounds like vagal syncope 11/28-11/30/17.  An echo then showed normal LVF with moderate LVH. The pt has bladder and uterine prolapse and was actually here today to have surgery but her EKG was abnormal and the pt admitted to a history of recent chest pain. Her pain was described as "sharp" then constant and Rt sided. Her EKG shows NSST changes and her Troponin is negative. She does have coronary Ca++ on CTA and carotid Ca++ on CXR.   PMHx:  Past Medical History:  Diagnosis Date  . Anxiety   . Asthma   . Brain aneurysm    had clipping ? date, also hx of right front temporal infarct   . Cerebral aneurysm   . Diabetes mellitus without complication ([place])    type II   . Hypertension     Past Surgical History:  Procedure Laterality Date  . BRAIN SURGERY  2009   due to aneurysm   . CHOLECYSTECTOMY    . TONSILLECTOMY    . TUBAL LIGATION      SOCHx:  reports that she has never smoked. She has never used smokeless tobacco. She reports that she drinks alcohol. She reports that she does not use drugs.  FAMHx: No family history on file.  ALLERGIES: No Known Allergies  ROS: Review of Systems: General: negative for chills, fever, night sweats or weight changes.  Cardiovascular: negative for dyspnea on exertion, edema, orthopnea, palpitations, paroxysmal nocturnal dyspnea or shortness of breath HEENT: negative for any visual disturbances, blindness, glaucoma Dermatological: negative for  rash Respiratory: negative for cough, hemoptysis, or wheezing Urologic: negative for hematuria or dysuria Abdominal: negative for nausea, vomiting, diarrhea, bright red blood per rectum, melena, or hematemesis Uterine prolapse Neurologic: negative for visual changes, syncope, or dizziness Musculoskeletal: negative for back pain, joint pain, or swelling Psych: cooperative and appropriate All other systems reviewed and are otherwise negative except as noted above.   HOME MEDICATIONS: Prior to Admission medications   Medication Sig Start Date End Date Taking? Authorizing Provider  albuterol (PROVENTIL HFA;VENTOLIN HFA) 108 (90 Base) MCG/ACT inhaler Inhale 2 puffs into the lungs every 6 (six) hours as needed for wheezing or shortness of breath.    Yes Historical Provider, MD  amLODipine (NORVASC) 10 MG tablet Take 10 mg by mouth daily.   Yes Historical Provider, MD  canagliflozin (INVOKANA) 300 MG TABS tablet Take 300 mg by mouth daily before breakfast.   Yes Historical Provider, MD  docusate sodium (COLACE) 100 MG capsule Take 100 mg by mouth daily.    Yes Historical Provider, MD  metoprolol succinate (TOPROL-XL) 100 MG 24 hr tablet Take 50-100 mg by mouth See admin instructions. Take with or immediately following a meal. Take 50mg  in the morning then take 100mg  in the evening   Yes Historical Provider, MD  phenazopyridine (PYRIDIUM) 200 MG tablet Take 200 mg by mouth once. Patient reports she is to take 2 hours  prior to surgery am of surgery   Yes Historical Provider, MD  valsartan (DIOVAN) 160 MG tablet Take 160 mg by mouth daily.   Yes Historical Provider, MD    HOSPITAL MEDICATIONS: I have reviewed the patient's current medications.  VITALS: Blood pressure 151/69, pulse 62, temperature 98.4 F (36.9 C), temperature source Oral, resp. rate 17, height 5\' 3"  (1.6 m), weight 163 lb (73.9 kg), SpO2 98 %.  PHYSICAL EXAM: General appearance: alert, cooperative and no distress Neck: no  carotid bruit and no JVD Lungs: clear to auscultation bilaterally Heart: regular rate and rhythm Abdomen: soft, non-tender; bowel sounds normal; no masses,  no organomegaly Extremities: extremities normal, atraumatic, no cyanosis or edema Pulses: 2+ and symmetric Skin: Skin color, texture, turgor normal. No rashes or lesions Neurologic: Grossly normal  LABS: Results for orders placed or performed during the hospital encounter of 12/09/16 (from the past 24 hour(s))  Basic metabolic panel     Status: Abnormal   Collection Time: 12/09/16 10:31 AM  Result Value Ref Range   Sodium 139 135 - 145 mmol/L   Potassium 3.9 3.5 - 5.1 mmol/L   Chloride 107 101 - 111 mmol/L   CO2 23 22 - 32 mmol/L   Glucose, Bld 139 (H) 65 - 99 mg/dL   BUN 21 (H) 6 - 20 mg/dL   Creatinine, Ser 0.95 0.44 - 1.00 mg/dL   Calcium 9.9 8.9 - 10.3 mg/dL   GFR calc non Af Amer 57 (L) >60 mL/min   GFR calc Af Amer >60 >60 mL/min   Anion gap 9 5 - 15  CBC     Status: None   Collection Time: 12/09/16 10:31 AM  Result Value Ref Range   WBC 6.2 4.0 - 10.5 K/uL   RBC 5.10 3.87 - 5.11 MIL/uL   Hemoglobin 13.9 12.0 - 15.0 g/dL   HCT 43.0 36.0 - 46.0 %   MCV 84.3 78.0 - 100.0 fL   MCH 27.3 26.0 - 34.0 pg   MCHC 32.3 30.0 - 36.0 g/dL   RDW 14.2 11.5 - 15.5 %   Platelets 304 150 - 400 K/uL  I-stat troponin, ED     Status: None   Collection Time: 12/09/16 10:47 AM  Result Value Ref Range   Troponin i, poc 0.01 0.00 - 0.08 ng/mL   Comment 3          CBG monitoring, ED     Status: Abnormal   Collection Time: 12/09/16 11:56 AM  Result Value Ref Range   Glucose-Capillary 120 (H) 65 - 99 mg/dL   Comment 1 Notify RN    Comment 2 Document in Chart     EKG: NSR  Echo 10/22/16 Study Conclusions  - Left ventricle: The cavity size was normal. Wall thickness was   increased in a pattern of moderate LVH. Systolic function was   normal. The estimated ejection fraction was in the range of 60%   to 65%. Wall motion was  normal; there were no regional wall   motion abnormalities. Doppler parameters are consistent with   abnormal left ventricular relaxation (grade 1 diastolic   dysfunction). - Left atrium: The atrium was mildly dilated.   IMAGING: Dg Chest 2 View  Result Date: 12/09/2016 CLINICAL DATA:  Chest pain. EXAM: CHEST  2 VIEW COMPARISON:  CT 10/21/2016 . FINDINGS: Mediastinum and hilar structures normal. Mild bibasilar subsegmental atelectasis. Heart size normal. No pleural effusion or pneumothorax. No acute bony abnormality. Bilateral carotid atherosclerotic vascular calcification .  IMPRESSION: 1.  Mild bibasilar subsegmental atelectasis. 2. Bilateral carotid atherosclerotic vascular disease. Electronically Signed   By: Marcello Moores  Register   On: 12/09/2016 11:21   Ct Angio Chest Pe W/cm &/or Wo Cm  Result Date: 12/09/2016 CLINICAL DATA:  Shortness of breath, chest pain, recent long distal air travel EXAM: CT ANGIOGRAPHY CHEST WITH CONTRAST TECHNIQUE: Multidetector CT imaging of the chest was performed using the standard protocol during bolus administration of intravenous contrast. Multiplanar CT image reconstructions and MIPs were obtained to evaluate the vascular anatomy. CONTRAST:  100 cc Isovue COMPARISON:  None. FINDINGS: Cardiovascular: Atherosclerotic calcifications of thoracic aorta. Heart size within normal limits. Atherosclerotic calcifications of coronary arteries. No pericardial effusion. Mitral valve calcifications are no the study is of excellent technical quality. No pulmonary embolus is noted. No evidence of aortic aneurysm. Mediastinum/Nodes: No mediastinal hematoma or adenopathy. Some air noted within esophagus. No hilar adenopathy is noted. Central airways are patent. Lungs/Pleura: Images of the lung parenchyma shows no infiltrate or pulmonary edema. Mild dependent atelectasis noted bilateral lower lobe posteriorly. Upper Abdomen: The visualized upper abdomen shows no adrenal gland mass. Status  post cholecystectomy. Visualized tail of the pancreas spleen and visualized liver is unremarkable. Musculoskeletal: No destructive bony lesions are noted. Sagittal images of the spine shows osteopenia and degenerative changes thoracic spine. Sagittal view of the sternum is unremarkable Review of the MIP images confirms the above findings. IMPRESSION: 1. No pulmonary embolus is noted. 2. Atherosclerotic calcifications of thoracic aorta and coronary arteries. 3. No mediastinal hematoma or adenopathy. 4. Degenerative changes thoracic spine. 5. No acute infiltrate or pulmonary edema. Electronically Signed   By: Lahoma Crocker M.D.   On: 12/09/2016 11:47    IMPRESSION: Principal Problem:   Chest pain- r/o cardiac  Active Problems:   History of syncope- hospitalized 11/28-11/30/17. Felt to be vagal.   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 Check Lipids and treat as appropriate. Consider OP carotid dopplers.   Time Spent Directly with Patient: 77 minutes  Kerin Ransom, [place] beeper 12/09/2016, 4:28 PM   As above, patient was seen and examined. Briefly she is a 76 year old female with past medical history of diabetes mellitus, hypertension, hyperlipidemia, cerebral aneurysm for evaluation of chest pain. Patient had a syncopal episode in November. Echocardiogram at that time showed normal LV function and moderate left ventricular hypertrophy; mild LAE. Patient requires gynecologic surgery. Her surgery was canceled recently because of abnormal electrocardiogram. Thursday she developed right sided chest pain. The pain was described as a sharp sensation radiating to her back. No associated symptoms. Some increase with certain movements and inspiration. Pain recurred today and she presented for further evaluation. CTA showed no pulmonary embolus but there was note  of coronary calcium. Electrocardiogram shows sinus rhythm, left ventricular hypertrophy and prior septal infarct cannot be excluded. Initial troponin normal.  1 chest pain-symptoms extremely atypical and likely musculoskeletal. Continue cycle enzymes. Patient had an echocardiogram in November. Would not repeat.  2 preoperative evaluation prior to gynecologic surgery-given chest pain, coronary calcification and long history of diabetes mellitus will arrange [place] nuclear study for risk stratification. If normal she may proceed with her surgery.  3 hypertension-continue preadmission blood pressure medications and adjust as needed.  4 diabetes mellitus-management per primary care. Would continue aspirin. She would benefit from a statin long-term but I will leave  to primary care.  Kirk Ruths, MD

## 2016-12-09 NOTE — ED Provider Notes (Signed)
[facility] DEPT Provider Note   CSN: [id number] Arrival date & time: 12/09/16  1023     History   Chief Complaint Chief Complaint  Patient presents with  . Chest Pain    HPI Jessica Rich is a 76 y.o. female.  Patient is a 50 roll female with a history of diabetes, hypertension, coronary artery disease and cerebral aneurysm status post clipping who presents with chest pain. She states 5 days ago she noted some sudden onset of sharp pain in her mid chest. It radiates to her scapula. She states it was intense and the sharp pain comes and goes but it's otherwise been a dull ache since that time. This morning she was going to the bathroom around 9:00 and the intense pain started back again. She states it's eased off now but she still feels a constant ache. She has some associated shortness of breath. No nausea vomiting or diaphoresis. No cough or congestion. No fevers. No history of PE. She did however travel to [place] in December and came back January 9. She denies any leg pain or swelling. She has had a stent placed in the 1970s in [place]. No known cardiac issues since that time. She denies any recent cardiac evaluation. She was getting preop evaluation for an upcoming urologic, chronologic surgery. On Friday she had an EKG that was reported to be abnormal and the surgery was canceled. She was scheduled for an appointment with cardiology on January 22.      Past Medical History:  Diagnosis Date  . Anxiety   . Asthma   . Brain aneurysm    had clipping ? date, also hx of right front temporal infarct   . Cerebral aneurysm   . Diabetes mellitus without complication ([place])    type II   . Hypertension     Patient Active Problem List   Diagnosis Date Noted  . Syncope and collapse 10/21/2016    Past Surgical History:  Procedure Laterality Date  . BRAIN SURGERY  2009   due to aneurysm   . CHOLECYSTECTOMY    . TONSILLECTOMY    . TUBAL LIGATION      OB History    No  data available       Home Medications    Prior to Admission medications   Medication Sig Start Date End Date Taking? Authorizing Provider  albuterol (PROVENTIL HFA;VENTOLIN HFA) 108 (90 Base) MCG/ACT inhaler Inhale 2 puffs into the lungs every 6 (six) hours as needed for wheezing or shortness of breath.    Yes Historical Provider, MD  amLODipine (NORVASC) 10 MG tablet Take 10 mg by mouth daily.   Yes Historical Provider, MD  canagliflozin (INVOKANA) 300 MG TABS tablet Take 300 mg by mouth daily before breakfast.   Yes Historical Provider, MD  docusate sodium (COLACE) 100 MG capsule Take 100 mg by mouth daily.    Yes Historical Provider, MD  metoprolol succinate (TOPROL-XL) 100 MG 24 hr tablet Take 50-100 mg by mouth See admin instructions. Take with or immediately following a meal. Take 50mg  in the morning then take 100mg  in the evening   Yes Historical Provider, MD  phenazopyridine (PYRIDIUM) 200 MG tablet Take 200 mg by mouth once. Patient reports she is to take 2 hours prior to surgery am of surgery   Yes Historical Provider, MD  valsartan (DIOVAN) 160 MG tablet Take 160 mg by mouth daily.   Yes Historical Provider, MD    Family History No family history on file.  Social History Social History  Substance Use Topics  . Smoking status: Never Smoker  . Smokeless tobacco: Never Used  . Alcohol use Yes     Comment: occ wine      Allergies   Patient has no known allergies.   Review of Systems Review of Systems  Constitutional: Negative for chills, diaphoresis, fatigue and fever.  HENT: Negative for congestion, rhinorrhea and sneezing.   Eyes: Negative.   Respiratory: Positive for shortness of breath. Negative for cough and chest tightness.   Cardiovascular: Positive for chest pain. Negative for leg swelling.  Gastrointestinal: Negative for abdominal pain, blood in stool, diarrhea, nausea and vomiting.  Genitourinary: Negative for difficulty urinating, flank pain, frequency and  hematuria.  Musculoskeletal: Negative for arthralgias and back pain.  Skin: Negative for rash.  Neurological: Negative for dizziness, speech difficulty, weakness, numbness and headaches.     Physical Exam Updated Vital Signs BP 173/69   Pulse 60   Temp 98.4 F (36.9 C) (Oral)   Resp 17   Ht 5\' 3"  (1.6 m)   Wt 163 lb (73.9 kg)   SpO2 99%   BMI 28.87 kg/m   Physical Exam  Constitutional: She is oriented to person, place, and time. She appears well-developed and well-nourished.  HENT:  Head: Normocephalic and atraumatic.  Eyes: Pupils are equal, round, and reactive to light.  Neck: Normal range of motion. Neck supple.  Cardiovascular: Normal rate, regular rhythm and normal heart sounds.   Pulmonary/Chest: Effort normal and breath sounds normal. No respiratory distress. She has no wheezes. She has no rales. She exhibits tenderness (Patient has some right-sided reproducible chest pain).  Abdominal: Soft. Bowel sounds are normal. There is no tenderness. There is no rebound and no guarding.  Musculoskeletal: Normal range of motion. She exhibits no edema.  No edema or calf tenderness  Lymphadenopathy:    She has no cervical adenopathy.  Neurological: She is alert and oriented to person, place, and time.  Skin: Skin is warm and dry. No rash noted.  Psychiatric: She has a normal mood and affect.     ED Treatments / Results  Labs (all labs ordered are listed, but only abnormal results are displayed) Labs Reviewed  BASIC METABOLIC PANEL - Abnormal; Notable for the following:       Result Value   Glucose, Bld 139 (*)    BUN 21 (*)    GFR calc non Af Amer 57 (*)    All other components within normal limits  CBG MONITORING, ED - Abnormal; Notable for the following:    Glucose-Capillary 120 (*)    All other components within normal limits  CBC  I-STAT TROPOININ, ED    EKG  EKG Interpretation  Date/Time:  Tuesday December 09 2016 10:26:02 EST Ventricular Rate:  76 PR  Interval:  172 QRS Duration: 102 QT Interval:  400 QTC Calculation: 450 R Axis:   46 Text Interpretation:  Normal sinus rhythm Possible Left atrial enlargement Borderline ECG similar to EKG from 10/21/16 Confirmed by [place] ([id number]) on 12/09/2016 10:45:37 AM       Radiology Dg Chest 2 View  Result Date: 12/09/2016 CLINICAL DATA:  Chest pain. EXAM: CHEST  2 VIEW COMPARISON:  CT 10/21/2016 . FINDINGS: Mediastinum and hilar structures normal. Mild bibasilar subsegmental atelectasis. Heart size normal. No pleural effusion or pneumothorax. No acute bony abnormality. Bilateral carotid atherosclerotic vascular calcification . IMPRESSION: 1.  Mild bibasilar subsegmental atelectasis. 2. Bilateral carotid atherosclerotic vascular disease. Electronically  Signed   By: Marcello Moores  Register   On: 12/09/2016 11:21   Ct Angio Chest Pe W/cm &/or Wo Cm  Result Date: 12/09/2016 CLINICAL DATA:  Shortness of breath, chest pain, recent long distal air travel EXAM: CT ANGIOGRAPHY CHEST WITH CONTRAST TECHNIQUE: Multidetector CT imaging of the chest was performed using the standard protocol during bolus administration of intravenous contrast. Multiplanar CT image reconstructions and MIPs were obtained to evaluate the vascular anatomy. CONTRAST:  100 cc Isovue COMPARISON:  None. FINDINGS: Cardiovascular: Atherosclerotic calcifications of thoracic aorta. Heart size within normal limits. Atherosclerotic calcifications of coronary arteries. No pericardial effusion. Mitral valve calcifications are no the study is of excellent technical quality. No pulmonary embolus is noted. No evidence of aortic aneurysm. Mediastinum/Nodes: No mediastinal hematoma or adenopathy. Some air noted within esophagus. No hilar adenopathy is noted. Central airways are patent. Lungs/Pleura: Images of the lung parenchyma shows no infiltrate or pulmonary edema. Mild dependent atelectasis noted bilateral lower lobe posteriorly. Upper Abdomen: The  visualized upper abdomen shows no adrenal gland mass. Status post cholecystectomy. Visualized tail of the pancreas spleen and visualized liver is unremarkable. Musculoskeletal: No destructive bony lesions are noted. Sagittal images of the spine shows osteopenia and degenerative changes thoracic spine. Sagittal view of the sternum is unremarkable Review of the MIP images confirms the above findings. IMPRESSION: 1. No pulmonary embolus is noted. 2. Atherosclerotic calcifications of thoracic aorta and coronary arteries. 3. No mediastinal hematoma or adenopathy. 4. Degenerative changes thoracic spine. 5. No acute infiltrate or pulmonary edema. Electronically Signed   By: Lahoma Crocker M.D.   On: 12/09/2016 11:47    Procedures Procedures (including critical care time)  Medications Ordered in ED Medications  iopamidol (ISOVUE-370) 76 % injection (100 mLs  Contrast Given 12/09/16 1131)  aspirin chewable tablet 324 mg (324 mg Oral Given 12/09/16 1333)     Initial Impression / Assessment and Plan / ED Course  I have reviewed the triage vital signs and the nursing notes.  Pertinent labs & imaging results that were available during my care of the patient were reviewed by me and considered in my medical decision making (see chart for details).  Clinical Course     PT Presents with chest pain. It's atypical. It's sharp and worse with breathing. There is no evidence of PE. There is no ischemic changes on EKG. Her troponin is negative. She has multiple risk factors however. I spoke with family practice service to admit the patient for unassigned medicine.  Final Clinical Impressions(s) / ED Diagnoses   Final diagnoses:  Nonspecific chest pain    New Prescriptions New Prescriptions   No medications on file     Malvin Johns, MD 12/09/16 1442

## 2016-12-10 ENCOUNTER — Observation Stay (HOSPITAL_BASED_OUTPATIENT_CLINIC_OR_DEPARTMENT_OTHER): Payer: Medicare HMO

## 2016-12-10 DIAGNOSIS — E119 Type 2 diabetes mellitus without complications: Secondary | ICD-10-CM | POA: Diagnosis not present

## 2016-12-10 DIAGNOSIS — R079 Chest pain, unspecified: Secondary | ICD-10-CM

## 2016-12-10 DIAGNOSIS — R072 Precordial pain: Secondary | ICD-10-CM | POA: Diagnosis not present

## 2016-12-10 DIAGNOSIS — I739 Peripheral vascular disease, unspecified: Secondary | ICD-10-CM | POA: Diagnosis not present

## 2016-12-10 DIAGNOSIS — I119 Hypertensive heart disease without heart failure: Secondary | ICD-10-CM | POA: Diagnosis not present

## 2016-12-10 LAB — HEMOGLOBIN A1C
Hgb A1c MFr Bld: 6.4 % — ABNORMAL HIGH (ref 4.8–5.6)
Mean Plasma Glucose: 137 mg/dL

## 2016-12-10 LAB — NM MYOCAR MULTI W/SPECT W/WALL MOTION / EF
Estimated workload: 1 METS
Exercise duration (min): 0 min
Exercise duration (sec): 0 s
LV dias vol: 62 mL (ref 46–106)
LV sys vol: 18 mL
MPHR: 144 {beats}/min
Peak HR: 101 {beats}/min
Percent HR: 70 %
RATE: 0.13
Rest HR: 67 {beats}/min
SDS: 2
SRS: 6
SSS: 8
TID: 1.26

## 2016-12-10 LAB — TROPONIN I: Troponin I: <0.03 ng/mL (ref <0.03)

## 2016-12-10 LAB — GLUCOSE, CAPILLARY: Glucose-Capillary: 132 mg/dL — ABNORMAL HIGH (ref 65–99)

## 2016-12-10 MED ORDER — METOPROLOL SUCCINATE ER 25 MG PO TB24: 25.0000 mg | ORAL_TABLET | Freq: Two times a day (BID) | ORAL | 0 refills | Status: AC

## 2016-12-10 MED ORDER — ASPIRIN EC 81 MG PO TBEC
81.0000 mg | DELAYED_RELEASE_TABLET | Freq: Every day | ORAL | Status: DC
  Administered 2016-12-10: 81 mg via ORAL
  Filled 2016-12-10: qty 1

## 2016-12-10 MED ORDER — TECHNETIUM TC 99M TETROFOSMIN IV KIT
30.0000 | PACK | Freq: Once | INTRAVENOUS | Status: AC | PRN
  Administered 2016-12-10: 30 via INTRAVENOUS

## 2016-12-10 MED ORDER — ACETAMINOPHEN 325 MG PO TABS
ORAL_TABLET | ORAL | Status: AC
  Filled 2016-12-10: qty 2

## 2016-12-10 MED ORDER — REGADENOSON 0.4 MG/5ML IV SOLN
0.4000 mg | Freq: Once | INTRAVENOUS | Status: AC
  Administered 2016-12-10: 0.4 mg via INTRAVENOUS
  Filled 2016-12-10: qty 5

## 2016-12-10 MED ORDER — ATORVASTATIN CALCIUM 40 MG PO TABS: 40.0000 mg | ORAL_TABLET | Freq: Every day | ORAL | 1 refills | Status: AC

## 2016-12-10 MED ORDER — TECHNETIUM TC 99M TETROFOSMIN IV KIT
10.0000 | PACK | Freq: Once | INTRAVENOUS | Status: AC | PRN
  Administered 2016-12-10: 10 via INTRAVENOUS

## 2016-12-10 MED ORDER — REGADENOSON 0.4 MG/5ML IV SOLN
INTRAVENOUS | Status: AC
  Filled 2016-12-10: qty 5

## 2016-12-10 MED ORDER — ASPIRIN 81 MG PO TBEC: 81.0000 mg | DELAYED_RELEASE_TABLET | Freq: Every day | ORAL | 1 refills | Status: AC

## 2016-12-10 NOTE — Progress Notes (Signed)
Patient Name: Jessica Rich Date of Encounter: 12/10/2016  Primary Cardiologist: Dr [facility] Problem List     Principal Problem:   Chest pain Active Problems:   History of syncope   PVD (peripheral vascular disease) ([place])   Hypertensive cardiovascular disease   Non-insulin treated type 2 diabetes mellitus (HCC)     Subjective   No chest pain or dyspnea  Inpatient Medications    Scheduled Meds: . amLODipine  10 mg Oral Daily  . atorvastatin  40 mg Oral q1800  . enoxaparin (LOVENOX) injection  40 mg Subcutaneous Q24H  . irbesartan  150 mg Oral Daily  . metoprolol tartrate  50 mg Oral BID   Continuous Infusions:  PRN Meds: acetaminophen, albuterol, ondansetron (ZOFRAN) IV   Vital Signs    Vitals:   12/09/16 1545 12/09/16 1758 12/09/16 2004 12/10/16 0505  BP: 151/69  135/68 (!) 146/95  Pulse: 62  (!) 58 (!) 57  Resp: 17     Temp:   98 F (36.7 C) 98.7 F (37.1 C)  TempSrc:   Oral Oral  SpO2: 98%  99% 99%  Weight:  162 lb 6.4 oz (73.7 kg)  162 lb 12.8 oz (73.8 kg)  Height:  5\' 3"  (1.6 m)      Intake/Output Summary (Last 24 hours) at 12/10/16 0731 Last data filed at 12/09/16 2015  Gross per 24 hour  Intake              480 ml  Output                0 ml  Net              480 ml   Filed Weights   12/09/16 1034 12/09/16 1758 12/10/16 0505  Weight: 163 lb (73.9 kg) 162 lb 6.4 oz (73.7 kg) 162 lb 12.8 oz (73.8 kg)    Physical Exam   GEN: Well nourished, well developed, in no acute distress.  HEENT: Grossly normal.  Neck: Supple Cardiac: RRR Respiratory:  CTA GI: Soft, nontender, nondistended MS: no deformity or atrophy. Skin: warm and dry, no rash. Neuro:  Strength and sensation are intact. Psych: AAOx3.  Normal affect.  Labs    CBC  Recent Labs  12/09/16 1031  WBC 6.2  HGB 13.9  HCT 43.0  MCV 84.3  PLT 123456   Basic Metabolic Panel  Recent Labs  12/09/16 1031  NA 139  K 3.9  CL 107  CO2 23  GLUCOSE 139*  BUN 21*    CREATININE 0.95  CALCIUM 9.9   Cardiac Enzymes  Recent Labs  12/09/16 1925 12/10/16 0020  TROPONINI <0.03 <0.03   Hemoglobin A1C  Recent Labs  12/09/16 1925  HGBA1C 6.4*   Fasting Lipid Panel  Recent Labs  12/09/16 1925  CHOL 208*  HDL 63  LDLCALC 132*  TRIG 65  CHOLHDL 3.3   Thyroid Function Tests  Recent Labs  12/09/16 1925  TSH 0.917    Telemetry    Sinus - Personally Reviewed   Radiology    Dg Chest 2 View  Result Date: 12/09/2016 CLINICAL DATA:  Chest pain. EXAM: CHEST  2 VIEW COMPARISON:  CT 10/21/2016 . FINDINGS: Mediastinum and hilar structures normal. Mild bibasilar subsegmental atelectasis. Heart size normal. No pleural effusion or pneumothorax. No acute bony abnormality. Bilateral carotid atherosclerotic vascular calcification . IMPRESSION: 1.  Mild bibasilar subsegmental atelectasis. 2. Bilateral carotid atherosclerotic vascular disease. Electronically Signed   By: Marcello Moores  Register  On: 12/09/2016 11:21   Ct Angio Chest Pe W/cm &/or Wo Cm  Result Date: 12/09/2016 CLINICAL DATA:  Shortness of breath, chest pain, recent long distal air travel EXAM: CT ANGIOGRAPHY CHEST WITH CONTRAST TECHNIQUE: Multidetector CT imaging of the chest was performed using the standard protocol during bolus administration of intravenous contrast. Multiplanar CT image reconstructions and MIPs were obtained to evaluate the vascular anatomy. CONTRAST:  100 cc Isovue COMPARISON:  None. FINDINGS: Cardiovascular: Atherosclerotic calcifications of thoracic aorta. Heart size within normal limits. Atherosclerotic calcifications of coronary arteries. No pericardial effusion. Mitral valve calcifications are no the study is of excellent technical quality. No pulmonary embolus is noted. No evidence of aortic aneurysm. Mediastinum/Nodes: No mediastinal hematoma or adenopathy. Some air noted within esophagus. No hilar adenopathy is noted. Central airways are patent. Lungs/Pleura: Images of  the lung parenchyma shows no infiltrate or pulmonary edema. Mild dependent atelectasis noted bilateral lower lobe posteriorly. Upper Abdomen: The visualized upper abdomen shows no adrenal gland mass. Status post cholecystectomy. Visualized tail of the pancreas spleen and visualized liver is unremarkable. Musculoskeletal: No destructive bony lesions are noted. Sagittal images of the spine shows osteopenia and degenerative changes thoracic spine. Sagittal view of the sternum is unremarkable Review of the MIP images confirms the above findings. IMPRESSION: 1. No pulmonary embolus is noted. 2. Atherosclerotic calcifications of thoracic aorta and coronary arteries. 3. No mediastinal hematoma or adenopathy. 4. Degenerative changes thoracic spine. 5. No acute infiltrate or pulmonary edema. Electronically Signed   By: Lahoma Crocker M.D.   On: 12/09/2016 11:47    Patient Profile     76 year old female with past medical history of diabetes mellitus, hypertension, hyperlipidemia, cerebral aneurysm with atypical chest pain. Echocardiogram 11/17 showed normal LV function and moderate left ventricular hypertrophy; mild LAE. Patient requires gynecologic surgery. Her surgery was canceled recently because of abnormal electrocardiogram. Developed atypical CP. CTA showed no pulmonary embolus but there was note of coronary calcium. Enzymes negative.  Assessment & Plan    1 chest pain-symptoms extremely atypical and likely musculoskeletal. Enzymes negative.  2 preoperative evaluation prior to gynecologic surgery-given chest pain, coronary calcification and long history of diabetes mellitus plan Lexiscan nuclear study for risk stratification. If normal she may proceed with her surgery.  3 hypertension-continue preadmission blood pressure medications and adjust as needed.  4 diabetes mellitus-management per primary care. Would continue aspirin. She would benefit from a statin long-term but I will leave to primary  care.  If nuclear study negative, no plans for further cardiac eval and pt can be DCed and fu with her primary care. Kirk Ruths, MD  Signed, Kirk Ruths, MD  12/10/2016, 7:31 AM

## 2016-12-10 NOTE — Progress Notes (Signed)
Myoview result below:   There was no ST segment deviation noted during stress.  No T wave inversion was noted during stress.  There is a small defect of mild severity present in the apex location. The defect is non-reversible and likely breast attenuation artifact vs. Apical thinning. No ischemia noted.  This is a low risk study.  The left ventricular ejection fraction is hyperdynamic (>65%).  Nuclear stress EF: 71%.   Patient is being discharged. Myoview report given to patient, overall reassuring. She is cleared for surgery as indicated from cardiology note early this morning.   Hilbert Corrigan PA Pager: [phone]

## 2016-12-10 NOTE — Care Management Obs Status (Signed)
[place] NOTIFICATION   Patient Details  Name: Cheryl Lesnik MRN: [id number] Date of Birth: [birth year]   Medicare Observation Status Notification Given:  Yes    Bethena Roys, RN 12/10/2016, 11:47 AM

## 2016-12-10 NOTE — Progress Notes (Signed)
1 day lexiscan myoview completed without complication. Pending final result by [facility] reader  Signed, Almyra Deforest PA Pager: [phone]

## 2016-12-15 ENCOUNTER — Ambulatory Visit: Payer: Medicare HMO | Admitting: Physician Assistant

## 2016-12-15 NOTE — Progress Notes (Deleted)
Cardiology Office Note    Date:  12/15/2016   ID:  Jessica Rich, DOB 24-Jan-1941, MRN [id number]  PCP:  Jessica Blocker, MD  Cardiologist:  ***   No chief complaint on file.   History of Present Illness:  Jessica Rich is a 76 y.o. female ***    Past Medical History:  Diagnosis Date  . Anxiety   . Asthma   . Blind right eye   . Brain aneurysm    had clipping ? date, also hx of right front temporal infarct   . Cerebral aneurysm   . Heart murmur    "when I was younger"  . History of blood transfusion 2009?   "think they gave me blood when I had my brain OR"  . Hypertension   . Type II diabetes mellitus ([place])     Past Surgical History:  Procedure Laterality Date  . BRAIN SURGERY  2009   due to aneurysm   . CATARACT EXTRACTION W/ INTRAOCULAR LENS  IMPLANT, BILATERAL    . DILATION AND CURETTAGE OF UTERUS     "after my children"  . LAPAROSCOPIC CHOLECYSTECTOMY    . SHOULDER OPEN ROTATOR CUFF REPAIR Left   . TONSILLECTOMY    . TUBAL LIGATION      Current Medications: Outpatient Medications Prior to Visit  Medication Sig Dispense Refill  . albuterol (PROVENTIL HFA;VENTOLIN HFA) 108 (90 Base) MCG/ACT inhaler Inhale 2 puffs into the lungs every 6 (six) hours as needed for wheezing or shortness of breath.     Marland Kitchen amLODipine (NORVASC) 10 MG tablet Take 10 mg by mouth daily.    Marland Kitchen aspirin EC 81 MG EC tablet Take 1 tablet (81 mg total) by mouth daily. 30 tablet 1  . atorvastatin (LIPITOR) 40 MG tablet Take 1 tablet (40 mg total) by mouth daily at 6 PM. 30 tablet 1  . canagliflozin (INVOKANA) 300 MG TABS tablet Take 300 mg by mouth daily before breakfast.    . docusate sodium (COLACE) 100 MG capsule Take 100 mg by mouth daily.     . metoprolol succinate (TOPROL-XL) 25 MG 24 hr tablet Take 1 tablet (25 mg total) by mouth 2 (two) times daily. 60 tablet 0  . phenazopyridine (PYRIDIUM) 200 MG tablet Take 200 mg by mouth once. Patient reports she is to take 2 hours prior to surgery  am of surgery    . valsartan (DIOVAN) 160 MG tablet Take 160 mg by mouth daily.     No facility-administered medications prior to visit.      Allergies:   Patient has no known allergies.   Social History   Social History  . Marital status: Widowed    Spouse name: N/A  . Number of children: N/A  . Years of education: N/A   Social History Main Topics  . Smoking status: Never Smoker  . Smokeless tobacco: Never Used  . Alcohol use Yes     Comment: 12/09/2016 "glass of wine a few times/year"  . Drug use: No  . Sexual activity: Not Currently   Other Topics Concern  . Not on file   Social History Narrative  . No narrative on file     Family History:  The patient's ***family history is not on file.   ROS:   Please see the history of present illness.    ROS All other systems reviewed and are negative.   PHYSICAL EXAM:   VS:  There were no vitals taken for this visit.  GEN: Well nourished, well developed, in no acute distress HEENT: normal Neck: no JVD, carotid bruits, or masses Cardiac: ***RRR; no murmurs, rubs, or gallops,no edema  Respiratory:  clear to auscultation bilaterally, normal work of breathing GI: soft, nontender, nondistended, + BS MS: no deformity or atrophy Skin: warm and dry, no rash Neuro:  Alert and Oriented x 3, Strength and sensation are intact Psych: euthymic mood, full affect  Wt Readings from Last 3 Encounters:  12/10/16 162 lb 12.8 oz (73.8 kg)  12/05/16 163 lb (73.9 kg)      Studies/Labs Reviewed:   EKG:  EKG is*** ordered today.  The ekg ordered today demonstrates ***  Recent Labs: 10/21/2016: ALT 22; Magnesium 2.0 12/09/2016: BUN 21; Creatinine, Ser 0.95; Hemoglobin 13.9; Platelets 304; Potassium 3.9; Sodium 139; TSH 0.917   Lipid Panel    Component Value Date/Time   CHOL 208 (H) 12/09/2016 1925   TRIG 65 12/09/2016 1925   HDL 63 12/09/2016 1925   CHOLHDL 3.3 12/09/2016 1925   VLDL 13 12/09/2016 1925   LDLCALC 132 (H)  12/09/2016 1925    Additional studies/ records that were reviewed today include:  ***    ASSESSMENT:    No diagnosis found.   PLAN:  In order of problems listed above:  1. ***    Medication Adjustments/Labs and Tests Ordered: Current medicines are reviewed at length with the patient today.  Concerns regarding medicines are outlined above.  Medication changes, Labs and Tests ordered today are listed in the Patient Instructions below. There are no Patient Instructions on file for this visit.   Jessica Rich, [place]  12/15/2016 7:08 AM    Jessica Rich, [place], [place]  [address] Phone: [phone]; Fax: [phone]

## 2017-01-01 ENCOUNTER — Other Ambulatory Visit: Payer: Self-pay | Admitting: Urology

## 2017-01-05 ENCOUNTER — Other Ambulatory Visit: Payer: Self-pay | Admitting: Family Medicine

## 2017-01-21 ENCOUNTER — Other Ambulatory Visit: Payer: Self-pay | Admitting: Obstetrics and Gynecology

## 2017-01-30 NOTE — Patient Instructions (Addendum)
Jessica Rich  01/30/2017   Your procedure is scheduled on: 02/10/17  Report to [facility] Main  Entrance take [facility]  elevators to 3rd floor to  [place] at 5:30 AM.  Call this number if you have problems the morning of surgery [phone]   Remember: ONLY 1 PERSON MAY GO WITH YOU TO SHORT STAY TO GET  READY MORNING OF [place].   Do not eat food or drink liquids :After Midnight.     Take these medicines the morning of surgery with A SIP OF WATER: amlodipine, metoprolol succinate, Albuterol inhaler if needed              You may not have any metal on your body including hair pins and              piercings  Do not wear jewelry, make-up, lotions, powders or perfumes, deodorant             Do not wear nail polish.  Do not shave  48 hours prior to surgery.              Men may shave face and neck.   Do not bring valuables to the hospital. [place].  Contacts, dentures or bridgework may not be worn into surgery.  Leave suitcase in the car. After surgery it may be brought to your room.                  Please read over the following fact sheets you were given: _____________________________________________________________________             [facility] - Preparing for Surgery Before surgery, you can play an important role.  Because skin is not sterile, your skin needs to be as free of germs as possible.  You can reduce the number of germs on your skin by washing with CHG (chlorahexidine gluconate) soap before surgery.  CHG is an antiseptic cleaner which kills germs and bonds with the skin to continue killing germs even after washing. Please DO NOT use if you have an allergy to CHG or antibacterial soaps.  If your skin becomes reddened/irritated stop using the CHG and inform your nurse when you arrive at Short Stay. Do not shave (including legs and underarms) for at least 48 hours prior to the first  CHG shower.  You may shave your face/neck. Please follow these instructions carefully:  1.  Shower with CHG Soap the night before surgery and the  morning of Surgery.  2.  If you choose to wash your hair, wash your hair first as usual with your  normal  shampoo.  3.  After you shampoo, rinse your hair and body thoroughly to remove the  shampoo.                           4.  Use CHG as you would any other liquid soap.  You can apply chg directly  to the skin and wash                       Gently with a scrungie or clean washcloth.  5.  Apply the CHG Soap to your body ONLY FROM THE NECK DOWN.   Do not  use on face/ open                           Wound or open sores. Avoid contact with eyes, ears mouth and genitals (private parts).                       Wash face,  Genitals (private parts) with your normal soap.             6.  Wash thoroughly, paying special attention to the area where your surgery  will be performed.  7.  Thoroughly rinse your body with warm water from the neck down.  8.  DO NOT shower/wash with your normal soap after using and rinsing off  the CHG Soap.                9.  Pat yourself dry with a clean towel.            10.  Wear clean pajamas.            11.  Place clean sheets on your bed the night of your first shower and do not  sleep with pets. Day of Surgery : Do not apply any lotions/deodorants the morning of surgery.  Please wear clean clothes to the hospital/surgery center.  FAILURE TO FOLLOW THESE INSTRUCTIONS MAY RESULT IN THE CANCELLATION OF YOUR SURGERY PATIENT SIGNATURE_________________________________  NURSE SIGNATURE__________________________________  ________________________________________________________________________ How to Manage Your Diabetes Before and After Surgery  Why is it important to control my blood sugar before and after surgery? . Improving blood sugar levels before and after surgery helps healing and can limit problems. . A way of  improving blood sugar control is eating a healthy diet by: o  Eating less sugar and carbohydrates o  Increasing activity/exercise o  Talking with your doctor about reaching your blood sugar goals . High blood sugars (greater than 180 mg/dL) can raise your risk of infections and slow your recovery, so you will need to focus on controlling your diabetes during the weeks before surgery. . Make sure that the doctor who takes care of your diabetes knows about your planned surgery including the date and location.  How do I manage my blood sugar before surgery? . Check your blood sugar at least 4 times a day, starting 2 days before surgery, to make sure that the level is not too high or low. o Check your blood sugar the morning of your surgery when you wake up and every 2 hours until you get to the Short Stay unit. . If your blood sugar is less than 70 mg/dL, you will need to treat for low blood sugar: o Do not take insulin. o Treat a low blood sugar (less than 70 mg/dL) with  cup of clear juice (cranberry or apple), 4 glucose tablets, OR glucose gel. o Recheck blood sugar in 15 minutes after treatment (to make sure it is greater than 70 mg/dL). If your blood sugar is not greater than 70 mg/dL on recheck, call [phone] for further instructions. . Report your blood sugar to the short stay nurse when you get to Short Stay.  . If you are admitted to the hospital after surgery: o Your blood sugar will be checked by the staff and you will probably be given insulin after surgery (instead of oral diabetes medicines) to make sure you have good blood sugar levels. o The goal for blood sugar control after  surgery is 80-180 mg/dL.   WHAT DO I DO ABOUT MY DIABETES MEDICATION?  Marland Kitchen Do not take oral diabetes medicines (pills) the morning of surgery.   WHAT IS A BLOOD TRANSFUSION? Blood Transfusion Information  A transfusion is the replacement of blood or some of its parts. Blood is made up of multiple  cells which provide different functions.  Red blood cells carry oxygen and are used for blood loss replacement.  White blood cells fight against infection.  Platelets control bleeding.  Plasma helps clot blood.  Other blood products are available for specialized needs, such as hemophilia or other clotting disorders. BEFORE THE TRANSFUSION  Who gives blood for transfusions?   Healthy volunteers who are fully evaluated to make sure their blood is safe. This is blood bank blood. Transfusion therapy is the safest it has ever been in the practice of medicine. Before blood is taken from a donor, a complete history is taken to make sure that person has no history of diseases nor engages in risky social behavior (examples are intravenous drug use or sexual activity with multiple partners). The donor's travel history is screened to minimize risk of transmitting infections, such as malaria. The donated blood is tested for signs of infectious diseases, such as HIV and hepatitis. The blood is then tested to be sure it is compatible with you in order to minimize the chance of a transfusion reaction. If you or a relative donates blood, this is often done in anticipation of surgery and is not appropriate for emergency situations. It takes many days to process the donated blood. RISKS AND COMPLICATIONS Although transfusion therapy is very safe and saves many lives, the main dangers of transfusion include:   Getting an infectious disease.  Developing a transfusion reaction. This is an allergic reaction to something in the blood you were given. Every precaution is taken to prevent this. The decision to have a blood transfusion has been considered carefully by your caregiver before blood is given. Blood is not given unless the benefits outweigh the risks. AFTER THE TRANSFUSION  Right after receiving a blood transfusion, you will usually feel much better and more energetic. This is especially true if your red  blood cells have gotten low (anemic). The transfusion raises the level of the red blood cells which carry oxygen, and this usually causes an energy increase.  The nurse administering the transfusion will monitor you carefully for complications. HOME CARE INSTRUCTIONS  No special instructions are needed after a transfusion. You may find your energy is better. Speak with your caregiver about any limitations on activity for underlying diseases you may have. SEEK MEDICAL CARE IF:   Your condition is not improving after your transfusion.  You develop redness or irritation at the intravenous (IV) site. SEEK IMMEDIATE MEDICAL CARE IF:  Any of the following symptoms occur over the next 12 hours:  Shaking chills.  You have a temperature by mouth above 102 F (38.9 C), not controlled by medicine.  Chest, back, or muscle pain.  People around you feel you are not acting correctly or are confused.  Shortness of breath or difficulty breathing.  Dizziness and fainting.  You get a rash or develop hives.  You have a decrease in urine output.  Your urine turns a dark color or changes to pink, red, or brown. Any of the following symptoms occur over the next 10 days:  You have a temperature by mouth above 102 F (38.9 C), not controlled by medicine.  Shortness  of breath.  Weakness after normal activity.  The white part of the eye turns yellow (jaundice).  You have a decrease in the amount of urine or are urinating less often.  Your urine turns a dark color or changes to pink, red, or brown. Document Released: 11/07/2000 Document Revised: 02/02/2012 Document Reviewed: 06/26/2008 [facility] Patient Information 2014 [place], [place].  _______________________________________________________________________

## 2017-02-02 ENCOUNTER — Encounter (HOSPITAL_COMMUNITY)
Admission: RE | Admit: 2017-02-02 | Discharge: 2017-02-02 | Disposition: A | Discharge status: Home / Self Care | Payer: Medicare HMO | Source: Ambulatory Visit | Attending: Urology | Admitting: Urology

## 2017-02-02 ENCOUNTER — Encounter (HOSPITAL_COMMUNITY): Payer: Self-pay

## 2017-02-02 DIAGNOSIS — Z6829 Body mass index (BMI) 29.0-29.9, adult: Secondary | ICD-10-CM | POA: Diagnosis not present

## 2017-02-02 DIAGNOSIS — Z01818 Encounter for other preprocedural examination: Secondary | ICD-10-CM | POA: Insufficient documentation

## 2017-02-02 DIAGNOSIS — N813 Complete uterovaginal prolapse: Secondary | ICD-10-CM | POA: Insufficient documentation

## 2017-02-02 DIAGNOSIS — N3941 Urge incontinence: Secondary | ICD-10-CM | POA: Diagnosis not present

## 2017-02-02 DIAGNOSIS — Z7984 Long term (current) use of oral hypoglycemic drugs: Secondary | ICD-10-CM | POA: Diagnosis not present

## 2017-02-02 DIAGNOSIS — Z79899 Other long term (current) drug therapy: Secondary | ICD-10-CM | POA: Diagnosis not present

## 2017-02-02 DIAGNOSIS — Z7982 Long term (current) use of aspirin: Secondary | ICD-10-CM | POA: Diagnosis not present

## 2017-02-02 DIAGNOSIS — H5461 Unqualified visual loss, right eye, normal vision left eye: Secondary | ICD-10-CM | POA: Diagnosis not present

## 2017-02-02 DIAGNOSIS — J45909 Unspecified asthma, uncomplicated: Secondary | ICD-10-CM | POA: Diagnosis not present

## 2017-02-02 DIAGNOSIS — I10 Essential (primary) hypertension: Secondary | ICD-10-CM | POA: Diagnosis not present

## 2017-02-02 DIAGNOSIS — E119 Type 2 diabetes mellitus without complications: Secondary | ICD-10-CM | POA: Diagnosis not present

## 2017-02-02 DIAGNOSIS — N814 Uterovaginal prolapse, unspecified: Secondary | ICD-10-CM | POA: Diagnosis present

## 2017-02-02 DIAGNOSIS — E1151 Type 2 diabetes mellitus with diabetic peripheral angiopathy without gangrene: Secondary | ICD-10-CM | POA: Diagnosis not present

## 2017-02-02 LAB — BASIC METABOLIC PANEL
Anion gap: 7 (ref 5–15)
BUN: 32 mg/dL — ABNORMAL HIGH (ref 6–20)
CALCIUM: 9.9 mg/dL (ref 8.9–10.3)
CO2: 25 mmol/L (ref 22–32)
Chloride: 105 mmol/L (ref 101–111)
Creatinine, Ser: 1.24 mg/dL — ABNORMAL HIGH (ref 0.44–1.00)
GFR, EST AFRICAN AMERICAN: 48 mL/min — AB (ref >60)
GFR, EST NON AFRICAN AMERICAN: 41 mL/min — AB (ref >60)
Glucose, Bld: 203 mg/dL — ABNORMAL HIGH (ref 65–99)
Potassium: 4.6 mmol/L (ref 3.5–5.1)
SODIUM: 137 mmol/L (ref 135–145)

## 2017-02-02 LAB — PROTIME-INR
INR: 0.9
PROTHROMBIN TIME: 12.2 s (ref 11.4–15.2)

## 2017-02-02 LAB — GLUCOSE, CAPILLARY: GLUCOSE-CAPILLARY: 199 mg/dL — AB (ref 65–99)

## 2017-02-02 NOTE — Progress Notes (Signed)
10/27/2016- Office visit from [facility]- Dr. Kevan Ny on chart 04/26/2016- labs on chart- CBC w/diff., CMP, Lipid panel, TSH, HmgA1C, microalbumin 10/09/16- Office note from Dr. Kevan Ny on chart

## 2017-02-03 LAB — CBC
HEMATOCRIT: 41.5 % (ref 36.0–46.0)
Hemoglobin: 13.2 g/dL (ref 12.0–15.0)
MCH: 26.7 pg (ref 26.0–34.0)
MCHC: 31.8 g/dL (ref 30.0–36.0)
MCV: 83.8 fL (ref 78.0–100.0)
Platelets: 332 10*3/uL (ref 150–400)
RBC: 4.95 MIL/uL (ref 3.87–5.11)
RDW: 14 % (ref 11.5–15.5)
WBC: 7.2 10*3/uL (ref 4.0–10.5)

## 2017-02-03 LAB — HEMOGLOBIN A1C
Hgb A1c MFr Bld: 6.5 % — ABNORMAL HIGH (ref 4.8–5.6)
Mean Plasma Glucose: 140 mg/dL

## 2017-02-04 ENCOUNTER — Other Ambulatory Visit: Payer: Self-pay | Admitting: Family Medicine

## 2017-02-09 MED ORDER — GENTAMICIN SULFATE 40 MG/ML IJ SOLN
300.0000 mg | Freq: Once | INTRAVENOUS | Status: AC
  Administered 2017-02-10: 300 mg via INTRAVENOUS
  Filled 2017-02-09 (×2): qty 7.5

## 2017-02-09 NOTE — H&P (Signed)
Urology Consult   Reason for referral: Prolapse: H&P  Chief Complaint: Prolapse  History of Present Illness: I was consult above the above provider to assist the patient's prolapse that has worsened over 12 months. She can feel vaginal bulging and she reduces it. The urine stream is good but attempts of spray. She may or may not be utilizing splinting maneuvers. She has urge incontinence with foot on the floor syndrome but denies bedwetting and stress incontinence and does not wear a pad   She voids every 2 or more hours and gets up once a night.   She recently was treated for urinary tract infection and may get up to 2 infections in a year. She denies a history of kidney stones and previous GU surgery. She is on oral hypoglycemics and tends towards constipation. The presentation is not been medically treated   There is no other aggravating or relieving factors  There is no other associated signs and symptoms  The severity of the symptoms is moderate  The symptoms are ongoing and bothersome     Past Medical History:  Diagnosis Date  . Anxiety   . Asthma   . Blind right eye   . Brain aneurysm    had clipping ? date, also hx of right front temporal infarct   . Cerebral aneurysm   . Heart murmur    "when I was younger"  . History of blood transfusion 2009?   "think they gave me blood when I had my brain OR"  . Hypertension   . Type II diabetes mellitus ([place])    Past Surgical History:  Procedure Laterality Date  . BRAIN SURGERY  2009   due to aneurysm   . CATARACT EXTRACTION W/ INTRAOCULAR LENS  IMPLANT, BILATERAL    . DILATION AND CURETTAGE OF UTERUS     "after my children"  . LAPAROSCOPIC CHOLECYSTECTOMY    . SHOULDER OPEN ROTATOR CUFF REPAIR Left   . TONSILLECTOMY    . TUBAL LIGATION      Medications: I have reviewed the patient's current medications. Allergies: No Known Allergies  No family history on file. Social History:  reports that she has never smoked. She has  never used smokeless tobacco. She reports that she drinks alcohol. She reports that she does not use drugs.  ROS: All systems are reviewed and negative except as noted. negative  Physical Exam:  Vital signs in last 24 hours:   Cardiovascular: Skin warm; not flushed Respiratory: Breaths quiet; no shortness of breath Abdomen: No masses Neurological: Normal sensation to touch Musculoskeletal: Normal motor function arms and legs Lymphatics: No inguinal adenopathy Skin: No rashes Genitourinary:on pelvic examination the patient had a grade 3 cystocele with moderate central defect. It descended just beyond the urethrovesical angle.. I felt that her cervix descended to or 3 cm she could not bear down or coughs hard. She had minimal to no rectocele. She had mild hyper the bladder neck and no stress incontinence. She had moderate atrophy.   Laboratory Data:  No results found for this or any previous visit (from the past 72 hour(s)). No results found for this or any previous visit (from the past 240 hour(s)). Creatinine:  Recent Labs  02/02/17 1053  CREATININE 1.24*    Xrays: See report/chart none  Impression/Assessment:  I was consult above the above provider to assist the patient's prolapse that has worsened over 12 months. She can feel vaginal bulging and she reduces it. The urine stream is good but  attempts of spray. She may or may not be utilizing splinting maneuvers. She has urge incontinence with foot on the floor syndrome but denies bedwetting and stress incontinence and does not wear a pad   She voids every 2 or more hours and gets up once a night.   on pelvic examination the patient had a grade 3 cystocele with moderate central defect. It descended just beyond the urethrovesical angle.. I felt that her cervix descended to or 3 cm she could not bear down or coughs hard. She had minimal to no rectocele. She had mild hyper the bladder neck and no stress incontinence. She had  moderate atrophy.   The patient has prolapse symptoms and mild urge incontinence. She does have spraying of urination and mild nocturia. a picture was drawn. If the patient ever had surgery she likely would benefit from a transvaginal hysterectomy and cystocele repair and graft and likely alcohol suspension or fixation of a cuff to the uterosacral ligaments. The role of urodynamics was discussed.the role of a gynecological referral was discussed. The patient wants to have this treated as soon as possible and I assured her I would do the best I could   Plan:  Picture was drawn. I reviewed a transvaginal hysterectomy with likely a vault prolapse repair and cystocele repair and graft is past reverse watchful waiting   After a thorough review of the management options for the patient's condition the patient  elected to proceed with surgical therapy as noted above. We have discussed the potential benefits and risks of the procedure, side effects of the proposed treatment, the likelihood of the patient achieving the goals of the procedure, and any potential problems that might occur during the procedure or recuperation. Informed consent has been obtained.     Jessica Rich A 02/09/2017, 7:39 AM      Jessica Rich A 02/09/2017, 7:39 AM

## 2017-02-10 ENCOUNTER — Observation Stay (HOSPITAL_COMMUNITY)
Admission: RE | Admit: 2017-02-10 | Discharge: 2017-02-11 | Disposition: A | Discharge status: Home / Self Care | Payer: Medicare HMO | Source: Ambulatory Visit | Attending: Urology | Admitting: Urology

## 2017-02-10 ENCOUNTER — Inpatient Hospital Stay (HOSPITAL_COMMUNITY): Payer: Medicare HMO | Admitting: Certified Registered"

## 2017-02-10 ENCOUNTER — Encounter (HOSPITAL_COMMUNITY): Payer: Self-pay | Admitting: *Deleted

## 2017-02-10 ENCOUNTER — Encounter (HOSPITAL_COMMUNITY)
Admission: RE | Disposition: A | Discharge status: Home / Self Care | Payer: Self-pay | Source: Ambulatory Visit | Attending: Urology

## 2017-02-10 DIAGNOSIS — N3941 Urge incontinence: Secondary | ICD-10-CM | POA: Diagnosis not present

## 2017-02-10 DIAGNOSIS — I10 Essential (primary) hypertension: Secondary | ICD-10-CM | POA: Diagnosis not present

## 2017-02-10 DIAGNOSIS — Z6829 Body mass index (BMI) 29.0-29.9, adult: Secondary | ICD-10-CM | POA: Insufficient documentation

## 2017-02-10 DIAGNOSIS — E119 Type 2 diabetes mellitus without complications: Secondary | ICD-10-CM | POA: Insufficient documentation

## 2017-02-10 DIAGNOSIS — N814 Uterovaginal prolapse, unspecified: Secondary | ICD-10-CM | POA: Diagnosis not present

## 2017-02-10 DIAGNOSIS — N8111 Cystocele, midline: Secondary | ICD-10-CM | POA: Diagnosis present

## 2017-02-10 DIAGNOSIS — Z7982 Long term (current) use of aspirin: Secondary | ICD-10-CM | POA: Insufficient documentation

## 2017-02-10 DIAGNOSIS — Z7984 Long term (current) use of oral hypoglycemic drugs: Secondary | ICD-10-CM | POA: Insufficient documentation

## 2017-02-10 DIAGNOSIS — H5461 Unqualified visual loss, right eye, normal vision left eye: Secondary | ICD-10-CM | POA: Insufficient documentation

## 2017-02-10 DIAGNOSIS — Z79899 Other long term (current) drug therapy: Secondary | ICD-10-CM | POA: Insufficient documentation

## 2017-02-10 DIAGNOSIS — E1151 Type 2 diabetes mellitus with diabetic peripheral angiopathy without gangrene: Secondary | ICD-10-CM | POA: Insufficient documentation

## 2017-02-10 DIAGNOSIS — J45909 Unspecified asthma, uncomplicated: Secondary | ICD-10-CM | POA: Insufficient documentation

## 2017-02-10 HISTORY — PX: VAGINAL HYSTERECTOMY: SHX2639

## 2017-02-10 HISTORY — DX: Headache: R51

## 2017-02-10 HISTORY — PX: SALPINGOOPHORECTOMY: SHX82

## 2017-02-10 HISTORY — PX: CYSTOSCOPY: SHX5120

## 2017-02-10 HISTORY — DX: Headache, unspecified: R51.9

## 2017-02-10 HISTORY — PX: CYSTOCELE REPAIR: SHX163

## 2017-02-10 LAB — GLUCOSE, CAPILLARY
GLUCOSE-CAPILLARY: 146 mg/dL — AB (ref 65–99)
GLUCOSE-CAPILLARY: 188 mg/dL — AB (ref 65–99)
Glucose-Capillary: 138 mg/dL — ABNORMAL HIGH (ref 65–99)
Glucose-Capillary: 149 mg/dL — ABNORMAL HIGH (ref 65–99)

## 2017-02-10 LAB — TYPE AND SCREEN
ABO/RH(D): AB POS
Antibody Screen: NEGATIVE

## 2017-02-10 LAB — HEMOGLOBIN AND HEMATOCRIT, BLOOD
HCT: 40 % (ref 36.0–46.0)
Hemoglobin: 12.8 g/dL (ref 12.0–15.0)

## 2017-02-10 SURGERY — COLPORRHAPHY, ANTERIOR, FOR CYSTOCELE REPAIR
Anesthesia: General | Site: Vagina

## 2017-02-10 MED ORDER — ONDANSETRON HCL 4 MG/2ML IJ SOLN
INTRAMUSCULAR | Status: AC
  Filled 2017-02-10: qty 2

## 2017-02-10 MED ORDER — HYDROCODONE-ACETAMINOPHEN 5-325 MG PO TABS: 1.0000 | ORAL_TABLET | Freq: Four times a day (QID) | ORAL | 0 refills | Status: AC | PRN

## 2017-02-10 MED ORDER — PHENYLEPHRINE 40 MCG/ML (10ML) SYRINGE FOR IV PUSH (FOR BLOOD PRESSURE SUPPORT)
PREFILLED_SYRINGE | INTRAVENOUS | Status: DC | PRN
  Administered 2017-02-10 (×7): 80 ug via INTRAVENOUS
  Administered 2017-02-10 (×2): 40 ug via INTRAVENOUS
  Administered 2017-02-10: 80 ug via INTRAVENOUS

## 2017-02-10 MED ORDER — ALBUTEROL SULFATE HFA 108 (90 BASE) MCG/ACT IN AERS: 2.0000 | INHALATION_SPRAY | Freq: Four times a day (QID) | RESPIRATORY_TRACT | Status: DC | PRN

## 2017-02-10 MED ORDER — ROCURONIUM BROMIDE 50 MG/5ML IV SOSY
PREFILLED_SYRINGE | INTRAVENOUS | Status: AC
  Filled 2017-02-10: qty 5

## 2017-02-10 MED ORDER — MIDAZOLAM HCL 2 MG/2ML IJ SOLN
INTRAMUSCULAR | Status: AC
  Filled 2017-02-10: qty 2

## 2017-02-10 MED ORDER — CEFAZOLIN SODIUM-DEXTROSE 2-4 GM/100ML-% IV SOLN
2.0000 g | INTRAVENOUS | Status: AC
  Administered 2017-02-10: 2 g via INTRAVENOUS

## 2017-02-10 MED ORDER — SUCCINYLCHOLINE CHLORIDE 200 MG/10ML IV SOSY
PREFILLED_SYRINGE | INTRAVENOUS | Status: AC
  Filled 2017-02-10: qty 10

## 2017-02-10 MED ORDER — FENTANYL CITRATE (PF) 100 MCG/2ML IJ SOLN
INTRAMUSCULAR | Status: AC
  Filled 2017-02-10: qty 2

## 2017-02-10 MED ORDER — PROPOFOL 10 MG/ML IV BOLUS
INTRAVENOUS | Status: AC
  Filled 2017-02-10: qty 40

## 2017-02-10 MED ORDER — INSULIN ASPART 100 UNIT/ML ~~LOC~~ SOLN: 0.0000 [IU] | Freq: Three times a day (TID) | SUBCUTANEOUS | Status: DC

## 2017-02-10 MED ORDER — ESTRADIOL 0.1 MG/GM VA CREA
TOPICAL_CREAM | VAGINAL | Status: AC
  Filled 2017-02-10: qty 85

## 2017-02-10 MED ORDER — STERILE WATER FOR IRRIGATION IR SOLN
Status: DC | PRN
  Administered 2017-02-10: 1000 mL

## 2017-02-10 MED ORDER — HYDROMORPHONE HCL 2 MG/ML IJ SOLN
INTRAMUSCULAR | Status: AC
  Filled 2017-02-10: qty 1

## 2017-02-10 MED ORDER — LIDOCAINE HCL (CARDIAC) 20 MG/ML IV SOLN
INTRAVENOUS | Status: DC | PRN
Start: 2017-02-10 — End: 2017-02-10
  Administered 2017-02-10: 80 mg via INTRATRACHEAL

## 2017-02-10 MED ORDER — HYDROMORPHONE HCL 1 MG/ML IJ SOLN
0.2500 mg | INTRAMUSCULAR | Status: DC | PRN
  Administered 2017-02-10 (×4): 0.5 mg via INTRAVENOUS

## 2017-02-10 MED ORDER — DEXAMETHASONE SODIUM PHOSPHATE 10 MG/ML IJ SOLN
INTRAMUSCULAR | Status: AC
  Filled 2017-02-10: qty 1

## 2017-02-10 MED ORDER — SODIUM CHLORIDE 0.9 % IV SOLN
INTRAVENOUS | Status: DC | PRN
  Administered 2017-02-10: 25 ug/min via INTRAVENOUS

## 2017-02-10 MED ORDER — PROPOFOL 10 MG/ML IV BOLUS
INTRAVENOUS | Status: DC | PRN
  Administered 2017-02-10: 150 mg via INTRAVENOUS

## 2017-02-10 MED ORDER — SODIUM CHLORIDE 0.9 % IR SOLN
Status: AC
  Filled 2017-02-10: qty 500000

## 2017-02-10 MED ORDER — LIDOCAINE-EPINEPHRINE (PF) 1 %-1:200000 IJ SOLN
INTRAMUSCULAR | Status: DC | PRN
  Administered 2017-02-10: 44 mL

## 2017-02-10 MED ORDER — PHENYLEPHRINE 40 MCG/ML (10ML) SYRINGE FOR IV PUSH (FOR BLOOD PRESSURE SUPPORT)
PREFILLED_SYRINGE | INTRAVENOUS | Status: AC
  Filled 2017-02-10: qty 10

## 2017-02-10 MED ORDER — LIDOCAINE-EPINEPHRINE (PF) 1 %-1:200000 IJ SOLN
INTRAMUSCULAR | Status: AC
  Filled 2017-02-10: qty 60

## 2017-02-10 MED ORDER — SUGAMMADEX SODIUM 200 MG/2ML IV SOLN
INTRAVENOUS | Status: DC | PRN
  Administered 2017-02-10: 150 mg via INTRAVENOUS

## 2017-02-10 MED ORDER — PHENAZOPYRIDINE HCL 200 MG PO TABS: 200.0000 mg | ORAL_TABLET | Freq: Once | ORAL | Status: DC

## 2017-02-10 MED ORDER — CANAGLIFLOZIN 300 MG PO TABS: 300.0000 mg | ORAL_TABLET | Freq: Every day | ORAL | Status: DC

## 2017-02-10 MED ORDER — DIPHENHYDRAMINE HCL 12.5 MG/5ML PO ELIX: 12.5000 mg | ORAL_SOLUTION | Freq: Four times a day (QID) | ORAL | Status: DC | PRN

## 2017-02-10 MED ORDER — LIDOCAINE-EPINEPHRINE (PF) 1 %-1:200000 IJ SOLN
INTRAMUSCULAR | Status: AC
  Filled 2017-02-10: qty 30

## 2017-02-10 MED ORDER — DOCUSATE SODIUM 100 MG PO CAPS
200.0000 mg | ORAL_CAPSULE | Freq: Every day | ORAL | Status: DC
  Administered 2017-02-10 – 2017-02-11 (×2): 200 mg via ORAL
  Filled 2017-02-10 (×3): qty 2

## 2017-02-10 MED ORDER — ONDANSETRON HCL 4 MG/2ML IJ SOLN: 4.0000 mg | INTRAMUSCULAR | Status: DC | PRN

## 2017-02-10 MED ORDER — IRBESARTAN 150 MG PO TABS
300.0000 mg | ORAL_TABLET | Freq: Every day | ORAL | Status: DC
  Administered 2017-02-10 – 2017-02-11 (×2): 300 mg via ORAL
  Filled 2017-02-10 (×2): qty 2

## 2017-02-10 MED ORDER — ATORVASTATIN CALCIUM 40 MG PO TABS
40.0000 mg | ORAL_TABLET | Freq: Every day | ORAL | Status: DC
  Administered 2017-02-10: 40 mg via ORAL
  Filled 2017-02-10: qty 1

## 2017-02-10 MED ORDER — HYDROCODONE-ACETAMINOPHEN 5-325 MG PO TABS: 1.0000 | ORAL_TABLET | ORAL | Status: DC | PRN

## 2017-02-10 MED ORDER — ESTRADIOL 0.1 MG/GM VA CREA
TOPICAL_CREAM | VAGINAL | Status: DC | PRN
  Administered 2017-02-10: 1 via VAGINAL

## 2017-02-10 MED ORDER — SODIUM CHLORIDE 0.45 % IV SOLN
INTRAVENOUS | Status: DC
  Administered 2017-02-10 – 2017-02-11 (×2): via INTRAVENOUS

## 2017-02-10 MED ORDER — ROCURONIUM BROMIDE 100 MG/10ML IV SOLN
INTRAVENOUS | Status: DC | PRN
  Administered 2017-02-10: 50 mg via INTRAVENOUS
  Administered 2017-02-10: 10 mg via INTRAVENOUS

## 2017-02-10 MED ORDER — MORPHINE SULFATE (PF) 4 MG/ML IV SOLN: 2.0000 mg | INTRAVENOUS | Status: DC | PRN

## 2017-02-10 MED ORDER — METOPROLOL SUCCINATE ER 25 MG PO TB24
25.0000 mg | ORAL_TABLET | Freq: Two times a day (BID) | ORAL | Status: DC
  Administered 2017-02-10 – 2017-02-11 (×2): 25 mg via ORAL
  Filled 2017-02-10 (×2): qty 1

## 2017-02-10 MED ORDER — CEFAZOLIN SODIUM-DEXTROSE 2-4 GM/100ML-% IV SOLN
INTRAVENOUS | Status: AC
  Filled 2017-02-10: qty 100

## 2017-02-10 MED ORDER — LIDOCAINE 2% (20 MG/ML) 5 ML SYRINGE
INTRAMUSCULAR | Status: AC
  Filled 2017-02-10: qty 5

## 2017-02-10 MED ORDER — PROMETHAZINE HCL 25 MG/ML IJ SOLN: 6.2500 mg | INTRAMUSCULAR | Status: DC | PRN

## 2017-02-10 MED ORDER — ALBUTEROL SULFATE (2.5 MG/3ML) 0.083% IN NEBU: 2.5000 mg | INHALATION_SOLUTION | Freq: Four times a day (QID) | RESPIRATORY_TRACT | Status: DC | PRN

## 2017-02-10 MED ORDER — LACTATED RINGERS IV SOLN
INTRAVENOUS | Status: DC
  Administered 2017-02-10 (×3): via INTRAVENOUS

## 2017-02-10 MED ORDER — ONDANSETRON HCL 4 MG/2ML IJ SOLN
INTRAMUSCULAR | Status: DC | PRN
  Administered 2017-02-10: 4 mg via INTRAVENOUS

## 2017-02-10 MED ORDER — ACETAMINOPHEN 325 MG PO TABS: 650.0000 mg | ORAL_TABLET | ORAL | Status: DC | PRN

## 2017-02-10 MED ORDER — AMLODIPINE BESYLATE 10 MG PO TABS
10.0000 mg | ORAL_TABLET | Freq: Every day | ORAL | Status: DC
  Administered 2017-02-11: 10 mg via ORAL
  Filled 2017-02-10: qty 1

## 2017-02-10 MED ORDER — FENTANYL CITRATE (PF) 100 MCG/2ML IJ SOLN
INTRAMUSCULAR | Status: DC | PRN
  Administered 2017-02-10 (×2): 50 ug via INTRAVENOUS

## 2017-02-10 MED ORDER — SODIUM CHLORIDE 0.9 % IR SOLN
Status: DC | PRN
  Administered 2017-02-10: 08:00:00

## 2017-02-10 MED ORDER — MIDAZOLAM HCL 2 MG/2ML IJ SOLN
INTRAMUSCULAR | Status: DC | PRN
  Administered 2017-02-10: 2 mg via INTRAVENOUS

## 2017-02-10 MED ORDER — DIPHENHYDRAMINE HCL 50 MG/ML IJ SOLN: 12.5000 mg | Freq: Four times a day (QID) | INTRAMUSCULAR | Status: DC | PRN

## 2017-02-10 MED ORDER — GLYCOPYRROLATE 0.2 MG/ML IJ SOLN
INTRAMUSCULAR | Status: DC | PRN
  Administered 2017-02-10: 0.2 mg via INTRAVENOUS

## 2017-02-10 MED ORDER — DEXAMETHASONE SODIUM PHOSPHATE 10 MG/ML IJ SOLN
INTRAMUSCULAR | Status: DC | PRN
  Administered 2017-02-10: 10 mg via INTRAVENOUS

## 2017-02-10 MED ORDER — PHENYLEPHRINE HCL 10 MG/ML IJ SOLN
INTRAMUSCULAR | Status: AC
  Filled 2017-02-10: qty 2

## 2017-02-10 SURGICAL SUPPLY — 65 items
BAG URINE DRAINAGE (UROLOGICAL SUPPLIES) ×6 IMPLANT
BAG URO CATCHER STRL LF (MISCELLANEOUS) IMPLANT
BLADE SURG 15 STRL LF DISP TIS (BLADE) ×4 IMPLANT
BLADE SURG 15 STRL SS (BLADE) ×2
CATH FOLEY 2WAY SLVR  5CC 14FR (CATHETERS) ×2
CATH FOLEY 2WAY SLVR 5CC 14FR (CATHETERS) ×4 IMPLANT
CLOTH BEACON ORANGE TIMEOUT ST (SAFETY) ×12 IMPLANT
COVER MAYO STAND STRL (DRAPES) IMPLANT
COVER SURGICAL LIGHT HANDLE (MISCELLANEOUS) ×6 IMPLANT
DECANTER SPIKE VIAL GLASS SM (MISCELLANEOUS) ×6 IMPLANT
DEVICE CAPIO SLIM SINGLE (INSTRUMENTS) IMPLANT
DRAIN PENROSE 18X1/4 LTX STRL (WOUND CARE) ×6 IMPLANT
DRAPE SHEET LG 3/4 BI-LAMINATE (DRAPES) ×6 IMPLANT
DRAPE STERI URO 9X17 APER PCH (DRAPES) IMPLANT
ELECT LIGASURE LONG (ELECTRODE) ×6 IMPLANT
ELECT LIGASURE SHORT 9 REUSE (ELECTRODE) IMPLANT
ELECT PENCIL ROCKER SW 15FT (MISCELLANEOUS) ×6 IMPLANT
GAUZE PACKING 2X5 YD STRL (GAUZE/BANDAGES/DRESSINGS) ×6 IMPLANT
GAUZE SPONGE 4X4 16PLY XRAY LF (GAUZE/BANDAGES/DRESSINGS) ×24 IMPLANT
GLOVE BIO SURGEON STRL SZ 6.5 (GLOVE) ×5 IMPLANT
GLOVE BIO SURGEONS STRL SZ 6.5 (GLOVE) ×1
GLOVE BIOGEL M STRL SZ7.5 (GLOVE) ×6 IMPLANT
GLOVE BIOGEL PI IND STRL 7.0 (GLOVE) ×4 IMPLANT
GLOVE BIOGEL PI INDICATOR 7.0 (GLOVE) ×2
GLOVE ECLIPSE 6.5 STRL STRAW (GLOVE) ×6 IMPLANT
GLOVE ECLIPSE 8.5 STRL (GLOVE) ×6 IMPLANT
GOWN STRL REUS W/TWL XL LVL3 (GOWN DISPOSABLE) ×24 IMPLANT
HOLDER FOLEY CATH W/STRAP (MISCELLANEOUS) ×6 IMPLANT
IV NS 1000ML (IV SOLUTION)
IV NS 1000ML BAXH (IV SOLUTION) IMPLANT
KIT BASIN OR (CUSTOM PROCEDURE TRAY) ×6 IMPLANT
MANIFOLD NEPTUNE II (INSTRUMENTS) ×6 IMPLANT
NEEDLE HYPO 22GX1.5 SAFETY (NEEDLE) IMPLANT
NEEDLE MAYO 6 CRC TAPER PT (NEEDLE) ×6 IMPLANT
NEEDLE SPNL 22GX3.5 QUINCKE BK (NEEDLE) ×6 IMPLANT
NS IRRIG 1000ML POUR BTL (IV SOLUTION) IMPLANT
PACK CYSTO (CUSTOM PROCEDURE TRAY) ×6 IMPLANT
PACK GENERAL/GYN (CUSTOM PROCEDURE TRAY) ×6 IMPLANT
PAD OB MATERNITY 4.3X12.25 (PERSONAL CARE ITEMS) IMPLANT
PLUG CATH AND CAP STER (CATHETERS) ×6 IMPLANT
RETRACTOR STAY HOOK 5MM (MISCELLANEOUS) ×6 IMPLANT
SHEET LAVH (DRAPES) ×6 IMPLANT
SPONGE LAP 4X18 X RAY DECT (DISPOSABLE) ×6 IMPLANT
SUT CAPIO ETHIBPND (SUTURE) IMPLANT
SUT VIC AB 0 CT1 18XCR BRD 8 (SUTURE) ×8 IMPLANT
SUT VIC AB 0 CT1 27 (SUTURE) ×4
SUT VIC AB 0 CT1 27XBRD ANTBC (SUTURE) ×8 IMPLANT
SUT VIC AB 0 CT1 36 (SUTURE) ×6 IMPLANT
SUT VIC AB 0 CT1 8-18 (SUTURE) ×4
SUT VIC AB 2-0 CT1 27 (SUTURE) ×8
SUT VIC AB 2-0 CT1 27XBRD (SUTURE) ×16 IMPLANT
SUT VIC AB 2-0 SH 27 (SUTURE) ×4
SUT VIC AB 2-0 SH 27X BRD (SUTURE) ×8 IMPLANT
SUT VIC AB 3-0 SH 27 (SUTURE) ×4
SUT VIC AB 3-0 SH 27XBRD (SUTURE) ×8 IMPLANT
SUT VICRYL 0 UR6 27IN ABS (SUTURE) ×12 IMPLANT
SYR 10ML LL (SYRINGE) ×6 IMPLANT
TOWEL OR 17X26 10 PK STRL BLUE (TOWEL DISPOSABLE) ×12 IMPLANT
TOWEL OR NON WOVEN STRL DISP B (DISPOSABLE) ×6 IMPLANT
TRAY FOLEY CATH 16FR SILVER (SET/KITS/TRAYS/PACK) IMPLANT
TUBING CONNECTING 10 (TUBING) IMPLANT
TUBING CONNECTING 10' (TUBING)
WATER STERILE IRR 1000ML POUR (IV SOLUTION) ×6 IMPLANT
WATER STERILE IRR 1500ML POUR (IV SOLUTION) ×6 IMPLANT
YANKAUER SUCT BULB TIP 10FT TU (MISCELLANEOUS) ×6 IMPLANT

## 2017-02-10 NOTE — Anesthesia Procedure Notes (Signed)
Procedure Name: Intubation Date/Time: 02/10/2017 7:44 AM Performed by: Pilar Grammes Pre-anesthesia Checklist: Patient identified, Emergency Drugs available, Suction available, Patient being monitored and Timeout performed Patient Re-evaluated:Patient Re-evaluated prior to inductionOxygen Delivery Method: Circle system utilized Preoxygenation: Pre-oxygenation with 100% oxygen Intubation Type: IV induction Ventilation: Mask ventilation without difficulty Laryngoscope Size: Miller and 2 Grade View: Grade I Tube type: Oral Tube size: 7.0 mm Number of attempts: 1 Airway Equipment and Method: Stylet Placement Confirmation: positive ETCO2,  ETT inserted through vocal cords under direct vision,  CO2 detector and breath sounds checked- equal and bilateral Secured at: 22 cm Tube secured with: Tape Dental Injury: Teeth and Oropharynx as per pre-operative assessment

## 2017-02-10 NOTE — Transfer of Care (Signed)
Immediate Anesthesia Transfer of Care Note  Patient: Jessica Rich  Procedure(s) Performed: Procedure(s): ANTERIOR REPAIR (CYSTOCELE) (N/A) CYSTOSCOPY (N/A) VAGINAL HYSTERECTOMY (N/A) BILATERAL OOPHORECTOMY left salphingectomy (Bilateral)  Patient Location: PACU  Anesthesia Type:General  Level of Consciousness: alert , sedated, patient cooperative and responds to stimulation  Airway & Oxygen Therapy: Patient connected to face mask oxygen  Post-op Assessment: Report given to RN and Post -op Vital signs reviewed and stable  Post vital signs: stable  Last Vitals:  Vitals:   02/10/17 0540  BP: (!) 169/99  Pulse: 86  Resp: 18  Temp: 36.4 C    Last Pain:  Vitals:   02/10/17 0540  TempSrc: Oral         Complications: No apparent anesthesia complications

## 2017-02-10 NOTE — H&P (Signed)
Jessica Rich is an 76 y.o. female.G4P4 BF presents for surgical mgmt of uterovaginal prolapse and urinary incontinence  Pertinent Gynecological History: Menses: postmneopausal Blood transfusions: with brain surgery  Previous GYN Procedures: TL  Last mammogram: normal Date:2016 Last pap: normal Date: 2017 OB History: G4P4   Menstrual History: Menarche age: n/a No LMP recorded. Patient is postmenopausal.    Past Medical History:  Diagnosis Date  . Anxiety   . Asthma   . Blind right eye   . Brain aneurysm    had clipping ? date, also hx of right front temporal infarct   . Cerebral aneurysm   . Heart murmur    "when I was younger"  . History of blood transfusion 2009?   "think they gave me blood when I had my brain OR"  . Hypertension   . Type II diabetes mellitus ([place])     Past Surgical History:  Procedure Laterality Date  . BRAIN SURGERY  2009   due to aneurysm   . CATARACT EXTRACTION W/ INTRAOCULAR LENS  IMPLANT, BILATERAL    . DILATION AND CURETTAGE OF UTERUS     "after my children"  . LAPAROSCOPIC CHOLECYSTECTOMY    . SHOULDER OPEN ROTATOR CUFF REPAIR Left   . TONSILLECTOMY    . TUBAL LIGATION      No family history on file.  Social History:  reports that she has never smoked. She has never used smokeless tobacco. She reports that she drinks alcohol. She reports that she does not use drugs.  Allergies: No Known Allergies  No prescriptions prior to admission.    Review of Systems  All other systems reviewed and are negative.   There were no vitals taken for this visit. Physical Exam  Constitutional: She is oriented to person, place, and time. She appears well-developed and well-nourished.  HENT:  Head: Atraumatic.  Eyes: EOM are normal.  Neck: Neck supple.  Cardiovascular: Normal rate and regular rhythm.   Respiratory: Breath sounds normal.  GI: Soft.  Laparoscopy and laparotomy scars  Genitourinary:  Genitourinary Comments: Vulva nl Vagina  atrophic mucosa Cervix parous Uterus sl enlarged prolapsed Cystocele 3rd degree Adnexa nonpalp  Musculoskeletal: She exhibits no edema.  Neurological: She is alert and oriented to person, place, and time.  Skin: Skin is warm and dry.  Psychiatric: She has a normal mood and affect.    No results found for this or any previous visit (from the past 24 hour(s)).  No results found.  Assessment/Plan: IMP uterovaginal prolapse Cystocele Urinary incontinence Chronic HTN DM P) TVHBSO. Risk of surgery reviewed including infection, bleeding, poss need for blood transfusion and its risk, injury to surrounding organ structures , poss need for open procedure, inability to remove ovaries/tubes, fistula formation, bowel obstruction,. All ? answered  Jessica Rich A 02/10/2017, 4:55 AM

## 2017-02-10 NOTE — Interval H&P Note (Signed)
History and Physical Interval Note:  02/10/2017 7:15 AM  Jessica Rich  has presented today for surgery, with the diagnosis of CYSTOCELE AND VOLT PROLAPS  The various methods of treatment have been discussed with the patient and family. After consideration of risks, benefits and other options for treatment, the patient has consented to  Procedure(s): ANTERIOR REPAIR (CYSTOCELE) (N/A) VAULT PROLAPSE REPAIR WITH GRAFT (N/A) CYSTOSCOPY (N/A) VAGINAL HYSTERECTOMY (N/A) BILATERAL SALPINGO OOPHORECTOMY (Bilateral) as a surgical intervention .  The patient's history has been reviewed, patient examined, no change in status, stable for surgery.  I have reviewed the patient's chart and labs.  Questions were answered to the patient's satisfaction.     Benjimen Kelley A

## 2017-02-10 NOTE — Progress Notes (Signed)
New Post Note: Invokana  A/P: eGFR currently less than 45, so Invokana meets hold criteria. Will discontinue drug at this time.  Adrian Saran, PharmD, BCPS Pager [phone] 02/10/2017 2:59 PM

## 2017-02-10 NOTE — Anesthesia Postprocedure Evaluation (Addendum)
Anesthesia Post Note  Patient: Jessica Rich  Procedure(s) Performed: Procedure(s) (LRB): ANTERIOR REPAIR (CYSTOCELE) (N/A) CYSTOSCOPY (N/A) VAGINAL HYSTERECTOMY (N/A) BILATERAL OOPHORECTOMY left salphingectomy (Bilateral)  Patient location during evaluation: PACU Anesthesia Type: General Level of consciousness: awake, sedated and oriented Pain management: pain level controlled Vital Signs Assessment: post-procedure vital signs reviewed and stable Respiratory status: spontaneous breathing, nonlabored ventilation, respiratory function stable and patient connected to nasal cannula oxygen Cardiovascular status: blood pressure returned to baseline and stable Postop Assessment: no signs of nausea or vomiting Anesthetic complications: no       Last Vitals:  Vitals:   02/10/17 1300 02/10/17 1409  BP: (!) 104/58 (!) 147/80  Pulse: 61 68  Resp: 12 12  Temp:  (!) 35.5 C    Last Pain:  Vitals:   02/10/17 1409  TempSrc: Axillary  PainSc:                  Carlynn Leduc,JAMES TERRILL

## 2017-02-10 NOTE — Op Note (Signed)
Preoperative diagnosis: Cystocele and uterine prolapse Postoperative diagnosis: Cystocele and uterine prolapse Surgery: Cystocele repair and cystoscopy Surgeon: Dr. Nicki Reaper Jd Mccaster Assistant: Debbrah Alar  The patient has the above diagnoses and consented to the above procedure. She underwent a transvaginal hysterectomy and removal of her ovaries by Dr. cousins and I assisted. The ureteral sacral ligaments were tagged. The vaginal cuff posteriorly was ran.  The assistant was present and necessary for all steps of the operation described. The assistant played a critical role assisting during the operation  Following the hysterectomy I performed cystoscopy. There was no bladder injury. There was excellent efflux bilaterally. The ureteral sacral ligaments were tagged and were well supported. I felt the patient probably did not need a vault suspension at this stage and later in the case it was obvious she did not need a vault suspension. She had a small grade 3 cystocele or moderate grade 2 with a central defect  25 mL of a lidocaine epinephrine mixture was applied submucosally. With my usual technique and Allis clamps I made a midline incision and sharply mobilize the vaginal wall mucosa from the underlying pubocervical fascia to the white line bilaterally. I mobilized very well at the apex.  Without imbricating the bladder neck I did a 2 layer repair with 2-0 Vicryl running suture. I was happy with the support and strength of the repair. Again it was obvious she did not need a vault suspension.  I re-cystoscoped the patient. There was no distortion of the trigone or ureters. Cystoscopically she had a very good anterior repair. Again the urine from both ureters was noted.  I trimmed an appropriate amount of anterior vaginal wall mucosa and closed the midline with running 2-0 Vicryl on a CT1 needle.  She had a narrow pelvis and she did not need a culdoplasty. There was minimal cul-de-sac. I closed  the vaginal cuff from left to right and right to left from each apex towards the midline with running 0 Vicryl. I reapproximated the ureteral sacral ligaments in the midline. I was very pleased with the closure  At the end of the case the patient had excellent support and excellent length anteriorly and posteriorly. She no vaginal narrowing. Blood loss was less than 150 mL. Urine output was excellent. Leg position was good  Hopefully the procedure will reach the patient's treatment goal

## 2017-02-10 NOTE — Brief Op Note (Signed)
02/10/2017  9:32 AM  PATIENT:  Jessica Rich  76 y.o. female  PRE-OPERATIVE DIAGNOSIS:  CYSTOCELE, uterovaginal prolapse  POST-OPERATIVE DIAGNOSIS:  Cystocele, uterovaginal prolapse  PROCEDURE:  Total vaginal hysterectomy, right oopherectomy, LSO  SURGEON:  Surgeon(s) and Role: Panel 1:    * Bjorn Loser, MD - Primary  Panel 2:    * Servando Salina, MD - Primary  PHYSICIAN ASSISTANT:   ASSISTANTS: Bjorn Loser, MD   ANESTHESIA:   general Findings: RV uterus with small fibroids and cervix, surgical absent right tube, nl left tube and ovary, nl right ov, large cystocele, atrophic vagina, nl ureters( by cystoscopy) EBL:  Total I/O In: 1000 [I.V.:1000] Out: 450 [Urine:150; Blood:300]  BLOOD ADMINISTERED:none  DRAINS: none   LOCAL MEDICATIONS USED:  LIDOCAINE   SPECIMEN:  Source of Specimen:  uterus, with cervix, left tube and ovary, right ovary  DISPOSITION OF SPECIMEN:  PATHOLOGY  COUNTS:  YES  TOURNIQUET:  * No tourniquets in log *  DICTATION: .492010  PLAN OF CARE: Admit for overnight observation  PATIENT DISPOSITION:  PACU - hemodynamically stable.   Delay start of Pharmacological VTE agent (>24hrs) due to surgical blood loss or risk of bleeding: no

## 2017-02-10 NOTE — Anesthesia Preprocedure Evaluation (Addendum)
Anesthesia Evaluation  Patient identified by MRN, date of birth, ID band Patient awake    Reviewed: Allergy & Precautions, NPO status , Patient's Chart, lab work & pertinent test results  History of Anesthesia Complications Negative for: history of anesthetic complications  Airway Mallampati: II  TM Distance: >3 FB     Dental  (+) Missing, Dental Advisory Given, Chipped   Pulmonary asthma ,    breath sounds clear to auscultation       Cardiovascular hypertension, + Peripheral Vascular Disease   Rhythm:Regular Rate:Normal     Neuro/Psych    GI/Hepatic negative GI ROS, Neg liver ROS,   Endo/Other  diabetesMorbid obesity  Renal/GU negative Renal ROS     Musculoskeletal   Abdominal   Peds  Hematology negative hematology ROS (+)   Anesthesia Other Findings   Reproductive/Obstetrics                            Anesthesia Physical Anesthesia Plan  ASA: III  Anesthesia Plan: General   Post-op Pain Management:    Induction: Intravenous  Airway Management Planned: Oral ETT  Additional Equipment:   Intra-op Plan:   Post-operative Plan: Extubation in OR  Informed Consent: I have reviewed the patients History and Physical, chart, labs and discussed the procedure including the risks, benefits and alternatives for the proposed anesthesia with the patient or authorized representative who has indicated his/her understanding and acceptance.   Dental advisory given  Plan Discussed with:   Anesthesia Plan Comments:         Anesthesia Quick Evaluation

## 2017-02-11 ENCOUNTER — Encounter (HOSPITAL_COMMUNITY): Payer: Self-pay | Admitting: Urology

## 2017-02-11 DIAGNOSIS — N814 Uterovaginal prolapse, unspecified: Secondary | ICD-10-CM | POA: Diagnosis not present

## 2017-02-11 LAB — BASIC METABOLIC PANEL
ANION GAP: 8 (ref 5–15)
BUN: 29 mg/dL — ABNORMAL HIGH (ref 6–20)
CHLORIDE: 104 mmol/L (ref 101–111)
CO2: 25 mmol/L (ref 22–32)
Calcium: 9.3 mg/dL (ref 8.9–10.3)
Creatinine, Ser: 1.03 mg/dL — ABNORMAL HIGH (ref 0.44–1.00)
GFR calc non Af Amer: 51 mL/min — ABNORMAL LOW (ref >60)
GFR, EST AFRICAN AMERICAN: 60 mL/min — AB (ref >60)
Glucose, Bld: 177 mg/dL — ABNORMAL HIGH (ref 65–99)
POTASSIUM: 4 mmol/L (ref 3.5–5.1)
SODIUM: 137 mmol/L (ref 135–145)

## 2017-02-11 LAB — GLUCOSE, CAPILLARY
GLUCOSE-CAPILLARY: 175 mg/dL — AB (ref 65–99)
GLUCOSE-CAPILLARY: 184 mg/dL — AB (ref 65–99)

## 2017-02-11 LAB — HEMOGLOBIN AND HEMATOCRIT, BLOOD
HEMATOCRIT: 33.8 % — AB (ref 36.0–46.0)
HEMOGLOBIN: 10.9 g/dL — AB (ref 12.0–15.0)

## 2017-02-11 NOTE — Op Note (Signed)
NAME:  Jessica Rich, Jessica Rich                  ACCOUNT NO.:  MEDICAL RECORD NO.:  [id number]  LOCATION:                                 FACILITY:  PHYSICIAN:  Servando Salina, M.D.    DATE OF BIRTH:  DATE OF PROCEDURE:  02/10/2017 DATE OF DISCHARGE:                              OPERATIVE REPORT   PREOPERATIVE DIAGNOSES: 1. Cystocele. 2. Uterovaginal prolapse.  PROCEDURE: 1. Total vaginal hysterectomy. 2. Left salpingo-oophorectomy. 3. Right oophorectomy.  POSTOPERATIVE DIAGNOSES: 1. Cystocele. 2. Uterovaginal prolapse.  ANESTHESIA:  General.  SURGEON:  Servando Salina, MD.  ASSISTANTBjorn Loser, MD,  DESCRIPTION OF PROCEDURE:  Procedure under adequate general anesthesia, the patient was placed in the dorsal lithotomy position.  Her legs were positioned prior to sterilely prepping and draping the patient.  Foley catheter was placed without the bag.  Examination under anesthesia revealed large cystocele, atrophic vagina, and cervix that was slightly was flushed to the vaginal wall.  A weighted speculum was placed in the vagina.  Sims retractor was placed anteriorly.  The posterior lip of the cervix was grasped with a single-tooth tenaculum and the anterior lip were grasped with a Jacobson clamp.  Using lidocaine with epinephrine at the cervicovaginal junction, this solution was injected circumferentially.  A circumferential incision was then made at that junction.  The posterior vaginal cul-de-sac was subsequently opened and the vaginal cuff was oversewn with 0 Vicryl running lock stitch.  The utero-sacral ligaments were bilaterally clamped, cut, and suture ligated with 0 Vicryl bilaterally.  Anteriorly, careful dissection was continued until the anterior cul-de-sac itself was also opened.  The weighted speculum was repositioned posteriorly.  Using the LigaSure, the cardinal ligaments bilaterally were clamped, cauterized, and cut followed by the uterine vessels  bilaterally which were serially clamped, cauterized, and then cut.  This was carried up until the utero-ovarian ligament on the left was reached which was then serially clamped, cauterized, and subsequently cut.  At the same time, the left fallopian tube had fallen into the field.  The bowels were constantly being having to be displaced superiorly.  A Kary Kos was then used to grasp the fallopian tube that was visually seen.  The ovary at that point had not been seen on the left.  On the opposite side, we continued dissecting the right ovary that was noted.  The right fallopian tube was not seen at all.  The IP ligament was subsequently serially clamped, cauterized, and cut with removal of the specimen containing the right ovary.  No tube was noted on the right side.  The patient had a history of a tubal ligation. Attention was then turned back to the left adnexa which after being identified, the ovary was also noted and at that point the mesosalpinx was serially clamped, cauterized, and cut.  The left IP ligament was also serially clamped, cauterized, and then cut.  That was also then removed.  Bleeding on the right aspect of the vaginal cuff was hemostasis with another figure-of-eight suture.  The procedure was then turned over to Dr. Matilde Sprang, who subsequently performed a cystoscopy with both urethral jets were seen well.  Please see his dictated operative note  for the remainder of the procedure.  SPECIMEN:  Uterus with cervix and right ovary along with the left tube and ovary, which was separate tissue.  ESTIMATED BLOOD LOSS:  About 30 mL.  COMPLICATIONS:  None.  The patient tolerated the procedure well, was transferred to recovery room in stable condition.     Servando Salina, M.D.     [place]enn [place]/MEDQ  D:  02/10/2017  T:  02/10/2017  Job:  [id number]

## 2017-02-11 NOTE — Progress Notes (Signed)
Subjective: Patient reports tolerating PO foley in.    Objective: I have reviewed patient's vital signs.  vital signs, intake and output and labs. Vitals:   02/11/17 0228 02/11/17 0602  BP: 134/67 (!) 120/47  Pulse: 91 74  Resp: 16 16  Temp: 98.3 F (36.8 C) 97.5 F (36.4 C)   I/O last 3 completed shifts: In: 2620 [P.O.:240; I.V.:2380] Out: 1325 [Urine:1000; Blood:325] Total I/O In: 100 [I.V.:100] Out: -   Lab Results  Component Value Date   WBC 7.2 02/02/2017   HGB 10.9 (L) 02/11/2017   HCT 33.8 (L) 02/11/2017   MCV 83.8 02/02/2017   PLT 332 02/02/2017   Lab Results  Component Value Date   CREATININE 1.03 (H) 02/11/2017    EXAM General: alert, cooperative and no distress Resp: clear to auscultation bilaterally Cardio: regular rate and rhythm, S1, S2 normal, no murmur, click, rub or gallop GI: soft, non-tender; bowel sounds normal; no masses,  no organomegaly Extremities: no edema, redness or tenderness in the calves or thighs Vaginal Bleeding: none  Assessment: s/p Procedure(s): ANTERIOR REPAIR (CYSTOCELE) CYSTOSCOPY VAGINAL HYSTERECTOMY BILATERAL OOPHORECTOMY left salpingectomy: stable, progressing well and tolerating diet  Plan: Advance diet Encourage ambulation Discontinue IV fluids  D/c home after voiding trial. Gyn d/c instructions reviewed. f/u 6 weeks  LOS: 1 day    Mackenzy Grumbine A, MD 02/11/2017 8:15 AM    02/11/2017, 8:15 AM

## 2017-02-11 NOTE — Progress Notes (Signed)
After catheter removed, patient voided twice and PVR post void was 0cc for each void. MD notified and okayed patient to go home.

## 2017-02-11 NOTE — Discharge Instructions (Signed)
Call if temperature greater than equal to 100.4, nothing per vagina for 4-6 weeks or severe nausea vomiting, increased incisional pain , drainage or redness in the incision site, no straining with bowel movements, showers no bathI have reviewed discharge instructions in detail with the patient. They will follow-up with me or their physician as scheduled. My nurse will also be calling the patients as per protocol. As discussed with Dr. Matilde Sprang and Dr. Garwin Brothers.

## 2017-02-11 NOTE — Progress Notes (Signed)
Looks good Labs and vitals normal Instructions given home

## 2017-02-11 NOTE — Progress Notes (Signed)
UROLOGY PROGRESS NOTES  Assessment/Plan: Jessica Rich is a 76 y.o. female with a history of vaginal prolapse & cystocele s/p total vaginal hysterectomy and cystocele repair with cystoscopy on 3/20 with Dr. Garwin Brothers & Dr. Matilde Sprang.   Interval/Plan: NAEON. AFVSS overnight (soft BP in PACU postop). 1.0L UOP via Foley. Cr 1.03. Hb 10.9. Vaginal packing removed by MD.  - Saline lock - Foley catheter removal and TOV today - VP removed by MD this morning - OOB/ambulate/IS - Expect discharge later today  Subjective: Some pain/discomfort related to VP and Foley, otherwise pain controlled. NO n/v. Tolerating diet. Ambulating. + flatus.   Objective:  Vital signs in last 24 hours: Temp:  [95.9 F (35.5 C)-98.3 F (36.8 C)] 97.5 F (36.4 C) (03/21 0602) Pulse Rate:  [61-91] 74 (03/21 0602) Resp:  [8-17] 16 (03/21 0602) BP: (99-147)/(47-80) 120/47 (03/21 0602) SpO2:  [97 %-100 %] 99 % (03/21 0602) Weight:  [76.4 kg (168 lb 8 oz)] 76.4 kg (168 lb 8 oz) (03/21 0602)  03/20 0701 - 03/21 0700 In: 2620 [P.O.:240; I.V.:2380] Out: 1325 [Urine:1000; Blood:325]    Physical Exam:  General:  well-developed and well-nourished female in NAD, lying in bed, alert & oriented, pleasant HEENT: [place]/AT, EOMI, sclera anicteric, hearing grossly intact, no nasal discharge, MMM Respiratory: nonlabored respirations, satting well on RA, symmetrical chest rise Cardiovascular: pulse regular rate & rhythm Abdominal: soft, NTTP, nondistended, surgical incisions c/d/i without signs of exudate/erythema GU: Foley draining light orange urine, VP removed by MD with no active vaginal bleeding noted Extremities: warm, well-perfused, no c/c/e Neuro: no focal deficits   Data Review: Results for orders placed or performed during the hospital encounter of 02/10/17 (from the past 24 hour(s))  Glucose, capillary     Status: Abnormal   Collection Time: 02/10/17 11:14 AM  Result Value Ref Range   Glucose-Capillary 149  (H) 65 - 99 mg/dL   Comment 1 Notify RN    Comment 2 Document in Chart   Hemoglobin and hematocrit, blood     Status: None   Collection Time: 02/10/17  3:56 PM  Result Value Ref Range   Hemoglobin 12.8 12.0 - 15.0 g/dL   HCT 40.0 36.0 - 46.0 %  Glucose, capillary     Status: Abnormal   Collection Time: 02/10/17  4:48 PM  Result Value Ref Range   Glucose-Capillary 146 (H) 65 - 99 mg/dL  Glucose, capillary     Status: Abnormal   Collection Time: 02/10/17 10:28 PM  Result Value Ref Range   Glucose-Capillary 188 (H) 65 - 99 mg/dL  Basic metabolic panel     Status: Abnormal   Collection Time: 02/11/17  5:42 AM  Result Value Ref Range   Sodium 137 135 - 145 mmol/L   Potassium 4.0 3.5 - 5.1 mmol/L   Chloride 104 101 - 111 mmol/L   CO2 25 22 - 32 mmol/L   Glucose, Bld 177 (H) 65 - 99 mg/dL   BUN 29 (H) 6 - 20 mg/dL   Creatinine, Ser 1.03 (H) 0.44 - 1.00 mg/dL   Calcium 9.3 8.9 - 10.3 mg/dL   GFR calc non Af Amer 51 (L) >60 mL/min   GFR calc Af Amer 60 (L) >60 mL/min   Anion gap 8 5 - 15  Hemoglobin and hematocrit, blood     Status: Abnormal   Collection Time: 02/11/17  5:42 AM  Result Value Ref Range   Hemoglobin 10.9 (L) 12.0 - 15.0 g/dL   HCT 33.8 (L) 36.0 -  46.0 %    Imaging: None

## 2017-02-11 NOTE — Discharge Summary (Signed)
Date of admission: 02/10/2017  Date of discharge: 02/11/2017  Admission diagnosis: uterine prolapse and cystocele  Discharge diagnosis: uterine prolapse and cystocele  Secondary diagnoses: same  History and Physical: For full details, please see admission history and physical. Briefly, Jessica Rich is a 76 y.o. year old patient with the above diagnosis.   Hospital Course: Hysterectomy and cystocele repair; uneventful post op course  Laboratory values:  Recent Labs  02/10/17 1556 02/11/17 0542  HGB 12.8 10.9*  HCT 40.0 33.8*    Recent Labs  02/11/17 0542  CREATININE 1.03*    Disposition: Home  Discharge instruction: The patient was instructed to be ambulatory but told to refrain from heavy lifting, strenuous activity, or driving. Detailed  Discharge medications:  Allergies as of 02/11/2017   No Known Allergies     Medication List    STOP taking these medications   phenazopyridine 200 MG tablet Commonly known as:  PYRIDIUM     TAKE these medications   albuterol 108 (90 Base) MCG/ACT inhaler Commonly known as:  PROVENTIL HFA;VENTOLIN HFA Inhale 2 puffs into the lungs every 6 (six) hours as needed for wheezing or shortness of breath.   amLODipine 10 MG tablet Commonly known as:  NORVASC Take 10 mg by mouth daily.   aspirin 81 MG EC tablet Take 1 tablet (81 mg total) by mouth daily. What changed:  additional instructions   atorvastatin 40 MG tablet Commonly known as:  LIPITOR Take 1 tablet (40 mg total) by mouth daily at 6 PM.   docusate sodium 100 MG capsule Commonly known as:  COLACE Take 200 mg by mouth daily.   HYDROcodone-acetaminophen 5-325 MG tablet Commonly known as:  NORCO Take 1-2 tablets by mouth every 6 (six) hours as needed for moderate pain or severe pain.   INVOKANA 300 MG Tabs tablet Generic drug:  canagliflozin Take 300 mg by mouth daily before breakfast.   metoprolol succinate 25 MG 24 hr tablet Commonly known as:   TOPROL-XL Take 1 tablet (25 mg total) by mouth 2 (two) times daily.   valsartan 320 MG tablet Commonly known as:  DIOVAN Take 320 mg by mouth daily.       Followup:  Follow-up Information    Verenis Nicosia A, MD Follow up.   Specialty:  Urology Why:  office will call you with date and time of appointment Contact information: [place] [address] [phone]

## 2017-03-28 ENCOUNTER — Other Ambulatory Visit: Payer: Self-pay | Admitting: Family Medicine

## 2017-03-31 ENCOUNTER — Other Ambulatory Visit (HOSPITAL_COMMUNITY): Payer: Self-pay | Admitting: Nurse Practitioner

## 2017-03-31 ENCOUNTER — Ambulatory Visit (HOSPITAL_COMMUNITY)
Admission: RE | Admit: 2017-03-31 | Discharge: 2017-03-31 | Disposition: A | Discharge status: Home / Self Care | Payer: Medicare HMO | Source: Ambulatory Visit | Attending: Nurse Practitioner | Admitting: Nurse Practitioner

## 2017-03-31 DIAGNOSIS — M549 Dorsalgia, unspecified: Secondary | ICD-10-CM | POA: Insufficient documentation

## 2017-03-31 DIAGNOSIS — R52 Pain, unspecified: Secondary | ICD-10-CM

## 2017-03-31 DIAGNOSIS — M5137 Other intervertebral disc degeneration, lumbosacral region: Secondary | ICD-10-CM | POA: Diagnosis not present

## 2017-03-31 DIAGNOSIS — M5136 Other intervertebral disc degeneration, lumbar region: Secondary | ICD-10-CM | POA: Insufficient documentation

## 2017-03-31 DIAGNOSIS — I700 Atherosclerosis of aorta: Secondary | ICD-10-CM | POA: Diagnosis not present

## 2017-04-24 NOTE — Addendum Note (Signed)
Addendum  created 04/24/17 1239 by Rica Koyanagi, MD   Sign clinical note
